# Patient Record
Sex: Female | Born: 1935 | Race: White | Hispanic: No | Smoking: Former smoker
Health system: Southern US, Community
[De-identification: ages and names within clinical notes are randomized; demographics above are authoritative.]

## PROBLEM LIST (undated history)

## (undated) DIAGNOSIS — K219 Gastro-esophageal reflux disease without esophagitis: Secondary | ICD-10-CM

## (undated) DIAGNOSIS — E079 Disorder of thyroid, unspecified: Secondary | ICD-10-CM

## (undated) DIAGNOSIS — R7302 Impaired glucose tolerance (oral): Secondary | ICD-10-CM

## (undated) DIAGNOSIS — N183 Chronic kidney disease, stage 3 unspecified: Secondary | ICD-10-CM

## (undated) DIAGNOSIS — I10 Essential (primary) hypertension: Secondary | ICD-10-CM

## (undated) DIAGNOSIS — I639 Cerebral infarction, unspecified: Secondary | ICD-10-CM

## (undated) DIAGNOSIS — N289 Disorder of kidney and ureter, unspecified: Secondary | ICD-10-CM

## (undated) DIAGNOSIS — E785 Hyperlipidemia, unspecified: Secondary | ICD-10-CM

## (undated) DIAGNOSIS — G4730 Sleep apnea, unspecified: Secondary | ICD-10-CM

## (undated) DIAGNOSIS — I4891 Unspecified atrial fibrillation: Secondary | ICD-10-CM

## (undated) DIAGNOSIS — J449 Chronic obstructive pulmonary disease, unspecified: Secondary | ICD-10-CM

## (undated) HISTORY — PX: BLADDER SURGERY: SHX569

## (undated) HISTORY — PX: CATARACT EXTRACTION: SUR2

## (undated) HISTORY — PX: ABDOMINAL HYSTERECTOMY: SHX81

## (undated) HISTORY — PX: LAMINECTOMY: SHX219

---

## 2005-06-21 ENCOUNTER — Ambulatory Visit: Payer: Self-pay | Admitting: Family Medicine

## 2005-07-01 ENCOUNTER — Ambulatory Visit: Payer: Self-pay | Admitting: Family Medicine

## 2006-01-10 ENCOUNTER — Ambulatory Visit: Payer: Self-pay | Admitting: Family Medicine

## 2007-01-11 ENCOUNTER — Ambulatory Visit: Payer: Self-pay | Admitting: Family Medicine

## 2007-12-11 ENCOUNTER — Ambulatory Visit: Payer: Self-pay | Admitting: Unknown Physician Specialty

## 2009-02-12 ENCOUNTER — Ambulatory Visit: Payer: Self-pay | Admitting: Family Medicine

## 2010-05-12 ENCOUNTER — Ambulatory Visit: Payer: Self-pay | Admitting: Specialist

## 2010-09-20 ENCOUNTER — Ambulatory Visit: Payer: Self-pay | Admitting: Family Medicine

## 2011-04-20 ENCOUNTER — Inpatient Hospital Stay: Payer: Self-pay | Admitting: Internal Medicine

## 2013-06-05 ENCOUNTER — Ambulatory Visit: Payer: Self-pay | Admitting: Ophthalmology

## 2017-05-18 ENCOUNTER — Emergency Department
Admission: EM | Admit: 2017-05-18 | Discharge: 2017-05-21 | Disposition: A | Discharge status: Home / Self Care | Payer: Medicare Other | Attending: Emergency Medicine | Admitting: Emergency Medicine

## 2017-05-18 ENCOUNTER — Emergency Department: Payer: Medicare Other

## 2017-05-18 DIAGNOSIS — R0902 Hypoxemia: Secondary | ICD-10-CM | POA: Insufficient documentation

## 2017-05-18 DIAGNOSIS — F0281 Dementia in other diseases classified elsewhere with behavioral disturbance: Secondary | ICD-10-CM | POA: Diagnosis not present

## 2017-05-18 DIAGNOSIS — Z7901 Long term (current) use of anticoagulants: Secondary | ICD-10-CM | POA: Diagnosis not present

## 2017-05-18 DIAGNOSIS — Z79899 Other long term (current) drug therapy: Secondary | ICD-10-CM | POA: Diagnosis not present

## 2017-05-18 DIAGNOSIS — G301 Alzheimer's disease with late onset: Secondary | ICD-10-CM | POA: Insufficient documentation

## 2017-05-18 DIAGNOSIS — R451 Restlessness and agitation: Secondary | ICD-10-CM | POA: Diagnosis not present

## 2017-05-18 DIAGNOSIS — I10 Essential (primary) hypertension: Secondary | ICD-10-CM | POA: Diagnosis not present

## 2017-05-18 DIAGNOSIS — F0390 Unspecified dementia without behavioral disturbance: Secondary | ICD-10-CM

## 2017-05-18 DIAGNOSIS — R4182 Altered mental status, unspecified: Secondary | ICD-10-CM | POA: Diagnosis present

## 2017-05-18 HISTORY — DX: Cerebral infarction, unspecified: I63.9

## 2017-05-18 HISTORY — DX: Chronic obstructive pulmonary disease, unspecified: J44.9

## 2017-05-18 HISTORY — DX: Disorder of kidney and ureter, unspecified: N28.9

## 2017-05-18 HISTORY — DX: Sleep apnea, unspecified: G47.30

## 2017-05-18 HISTORY — DX: Essential (primary) hypertension: I10

## 2017-05-18 HISTORY — DX: Gastro-esophageal reflux disease without esophagitis: K21.9

## 2017-05-18 HISTORY — DX: Chronic kidney disease, stage 3 (moderate): N18.3

## 2017-05-18 HISTORY — DX: Chronic kidney disease, stage 3 unspecified: N18.30

## 2017-05-18 HISTORY — DX: Hyperlipidemia, unspecified: E78.5

## 2017-05-18 HISTORY — DX: Impaired glucose tolerance (oral): R73.02

## 2017-05-18 HISTORY — DX: Disorder of thyroid, unspecified: E07.9

## 2017-05-18 HISTORY — DX: Unspecified atrial fibrillation: I48.91

## 2017-05-18 LAB — URINALYSIS, COMPLETE (UACMP) WITH MICROSCOPIC
BILIRUBIN URINE: NEGATIVE
Bacteria, UA: NONE SEEN
Glucose, UA: NEGATIVE mg/dL
Hgb urine dipstick: NEGATIVE
KETONES UR: 5 mg/dL — AB
Leukocytes, UA: NEGATIVE
Nitrite: NEGATIVE
PH: 5 (ref 5.0–8.0)
Protein, ur: >=300 mg/dL — AB
Specific Gravity, Urine: 1.024 (ref 1.005–1.030)

## 2017-05-18 LAB — COMPREHENSIVE METABOLIC PANEL
ALK PHOS: 78 U/L (ref 38–126)
ALT: 31 U/L (ref 14–54)
AST: 57 U/L — AB (ref 15–41)
Albumin: 3.9 g/dL (ref 3.5–5.0)
Anion gap: 12 (ref 5–15)
BUN: 29 mg/dL — AB (ref 6–20)
CHLORIDE: 104 mmol/L (ref 101–111)
CO2: 22 mmol/L (ref 22–32)
CREATININE: 1.43 mg/dL — AB (ref 0.44–1.00)
Calcium: 9.2 mg/dL (ref 8.9–10.3)
GFR calc Af Amer: 40 mL/min — ABNORMAL LOW (ref >60)
GFR, EST NON AFRICAN AMERICAN: 34 mL/min — AB (ref >60)
Glucose, Bld: 110 mg/dL — ABNORMAL HIGH (ref 65–99)
Potassium: 3.7 mmol/L (ref 3.5–5.1)
Sodium: 138 mmol/L (ref 135–145)
TOTAL PROTEIN: 6.4 g/dL — AB (ref 6.5–8.1)
Total Bilirubin: 2.1 mg/dL — ABNORMAL HIGH (ref 0.3–1.2)

## 2017-05-18 LAB — CBC WITH DIFFERENTIAL/PLATELET
BASOS ABS: 0 10*3/uL (ref 0–0.1)
Basophils Relative: 0 %
Eosinophils Absolute: 0 10*3/uL (ref 0–0.7)
Eosinophils Relative: 0 %
HEMATOCRIT: 45.9 % (ref 35.0–47.0)
HEMOGLOBIN: 15.1 g/dL (ref 12.0–16.0)
LYMPHS PCT: 5 %
Lymphs Abs: 0.3 10*3/uL — ABNORMAL LOW (ref 1.0–3.6)
MCH: 28.1 pg (ref 26.0–34.0)
MCHC: 32.9 g/dL (ref 32.0–36.0)
MCV: 85.4 fL (ref 80.0–100.0)
Monocytes Absolute: 0.5 10*3/uL (ref 0.2–0.9)
Monocytes Relative: 7 %
NEUTROS ABS: 6.7 10*3/uL — AB (ref 1.4–6.5)
Neutrophils Relative %: 88 %
Platelets: 162 10*3/uL (ref 150–440)
RBC: 5.38 MIL/uL — AB (ref 3.80–5.20)
RDW: 14.8 % — ABNORMAL HIGH (ref 11.5–14.5)
WBC: 7.6 10*3/uL (ref 3.6–11.0)

## 2017-05-18 LAB — ETHANOL: Alcohol, Ethyl (B): <5 mg/dL (ref <5)

## 2017-05-18 LAB — URINE DRUG SCREEN, QUALITATIVE (ARMC ONLY)
AMPHETAMINES, UR SCREEN: NOT DETECTED
BARBITURATES, UR SCREEN: NOT DETECTED
Benzodiazepine, Ur Scrn: NOT DETECTED
COCAINE METABOLITE, UR ~~LOC~~: NOT DETECTED
Cannabinoid 50 Ng, Ur ~~LOC~~: NOT DETECTED
MDMA (ECSTASY) UR SCREEN: NOT DETECTED
METHADONE SCREEN, URINE: NOT DETECTED
OPIATE, UR SCREEN: NOT DETECTED
Phencyclidine (PCP) Ur S: NOT DETECTED
Tricyclic, Ur Screen: NOT DETECTED

## 2017-05-18 LAB — PROTIME-INR
INR: 3.42
Prothrombin Time: 35.3 seconds — ABNORMAL HIGH (ref 11.4–15.2)

## 2017-05-18 LAB — TROPONIN I
TROPONIN I: 0.04 ng/mL — AB (ref <0.03)
Troponin I: 0.03 ng/mL (ref <0.03)

## 2017-05-18 LAB — ACETAMINOPHEN LEVEL: Acetaminophen (Tylenol), Serum: <10 ug/mL — ABNORMAL LOW (ref 10–30)

## 2017-05-18 LAB — LIPASE, BLOOD: LIPASE: 18 U/L (ref 11–51)

## 2017-05-18 LAB — TSH: TSH: 3.281 u[IU]/mL (ref 0.350–4.500)

## 2017-05-18 LAB — SALICYLATE LEVEL: Salicylate Lvl: <7 mg/dL (ref 2.8–30.0)

## 2017-05-18 MED ORDER — LORAZEPAM 2 MG/ML IJ SOLN
INTRAMUSCULAR | Status: AC
  Administered 2017-05-18: 1 mg
  Filled 2017-05-18: qty 1

## 2017-05-18 MED ORDER — HALOPERIDOL LACTATE 5 MG/ML IJ SOLN
INTRAMUSCULAR | Status: AC
  Administered 2017-05-18: 5 mg
  Filled 2017-05-18: qty 1

## 2017-05-18 NOTE — ED Notes (Addendum)
Pt on 4L nasal canula at this time , 93%

## 2017-05-18 NOTE — ED Notes (Signed)
Date and time results received: 05/18/17 1221 (use smartphrase ".now" to insert current time)  Test: troponin Critical Value: 0.03   Name of Provider Notified: schaevitz

## 2017-05-18 NOTE — ED Notes (Signed)
Family at bedside asking for update, this RN made MD aware at this time

## 2017-05-18 NOTE — ED Notes (Signed)
PT IVC PENDING PSYCH CONSULT. 

## 2017-05-18 NOTE — ED Notes (Signed)
Date and time results received: 05/18/17 1400 (use smartphrase ".now" to insert current time)  Test: troponin  Critical Value: 0.04  Name of Provider Notified: schaevitz  Orders Received? Or Actions Taken?: Orders Received - See Orders for details and Actions Taken: none at this time

## 2017-05-18 NOTE — Consult Note (Signed)
[facility]   Reason for Consult:  Consult for 81 year old woman brought in by family under petition because of dementia with behavior problems Referring Physician:  Quentin Cornwall Patient Identification: Catherine Harris MRN:  [id number] Principal Diagnosis: Dementia Diagnosis:   Patient Active Problem List   Diagnosis Date Noted  . Dementia [F03.90] 05/18/2017    Total Time spent with patient: 1 hour  Subjective:   Catherine Harris is a 81 y.o. female patient admitted with "I'm fine, I just want to go home".  HPI:  Patient interviewed chart reviewed. 81 year old woman brought in under IVC. Patient was allegedly sitting on her porch with her underwear on tearing her own mail up into little pieces. This is just one example of several odd and irresponsible behaviors that her stepdaughter is reporting. Also reporting that the patient has been not eating well not sleeping well probably not taking care of herself and multiple times has been found not using her oxygen correctly. It sounds like the family has been increasingly concerned about her dementia. They have taken her to see a neurologist on several occasions. No insight about this. She denies having any problem with memory or function. Denies any misuse of her oxygen. She says her mood feels fine. She does admit that she doesn't sleep very well and that her appetite is down. She says she thinks she is doing fine with her medicine.  Social history: Lives by herself. Closest family member from what I can tell seems to be her stepdaughter. Sounds like family checks up on her reasonably regularly.  Medical history: Patient has a history of strokes. She is on chronic anticoagulation and I'm not completely clear what that's about. She is also supposed to be using home oxygen.  Substance abuse history: Patient denies any alcohol or drug abuse current or past. Collateral history from the family is that decades ago she had a  drinking problem but stopped probably at least 30 years ago    Past Psychiatric History: No known past psychiatric hospitalization. I confronted the patient about the allegation that she had mentioned [facility]. She completely denied having ever been to that hospital or any other psych hospital. Denied any history of diagnosis with mental health problems denied any suicidal or homicidal behavior. Had no awareness about her memory problems  Risk to Self:   Risk to Others:   Prior Inpatient Therapy:   Prior Outpatient Therapy:    Past Medical History: No past medical history on file. No past surgical history on file. Family History: No family history on file. Family Psychiatric  History: She does not know of any Social History:  History  Alcohol use Not on file     History  Drug use: Unknown    Social History   Social History  . Marital status: Widowed    Spouse name: N/A  . Number of children: N/A  . Years of education: N/A   Social History Main Topics  . Smoking status: Not on file  . Smokeless tobacco: Not on file  . Alcohol use Not on file  . Drug use: Unknown  . Sexual activity: Not on file   Other Topics Concern  . Not on file   Social History Narrative  . No narrative on file   Additional Social History:    Allergies:  Allergies not on file  Labs:  Results for orders placed or performed during the hospital encounter of 05/18/17 (from the past 48 hour(s))  CBC  with Differential     Status: Abnormal   Collection Time: 05/18/17 11:17 AM  Result Value Ref Range   WBC 7.6 3.6 - 11.0 K/uL   RBC 5.38 (H) 3.80 - 5.20 MIL/uL   Hemoglobin 15.1 12.0 - 16.0 g/dL   HCT 45.9 35.0 - 47.0 %   MCV 85.4 80.0 - 100.0 fL   MCH 28.1 26.0 - 34.0 pg   MCHC 32.9 32.0 - 36.0 g/dL   RDW 14.8 (H) 11.5 - 14.5 %   Platelets 162 150 - 440 K/uL   Neutrophils Relative % 88 %   Neutro Abs 6.7 (H) 1.4 - 6.5 K/uL   Lymphocytes Relative 5 %   Lymphs Abs 0.3 (L) 1.0 - 3.6 K/uL    Monocytes Relative 7 %   Monocytes Absolute 0.5 0.2 - 0.9 K/uL   Eosinophils Relative 0 %   Eosinophils Absolute 0.0 0 - 0.7 K/uL   Basophils Relative 0 %   Basophils Absolute 0.0 0 - 0.1 K/uL  Comprehensive metabolic panel     Status: Abnormal   Collection Time: 05/18/17 11:17 AM  Result Value Ref Range   Sodium 138 135 - 145 mmol/L   Potassium 3.7 3.5 - 5.1 mmol/L   Chloride 104 101 - 111 mmol/L   CO2 22 22 - 32 mmol/L   Glucose, Bld 110 (H) 65 - 99 mg/dL   BUN 29 (H) 6 - 20 mg/dL   Creatinine, Ser 1.43 (H) 0.44 - 1.00 mg/dL   Calcium 9.2 8.9 - 10.3 mg/dL   Total Protein 6.4 (L) 6.5 - 8.1 g/dL   Albumin 3.9 3.5 - 5.0 g/dL   AST 57 (H) 15 - 41 U/L   ALT 31 14 - 54 U/L   Alkaline Phosphatase 78 38 - 126 U/L   Total Bilirubin 2.1 (H) 0.3 - 1.2 mg/dL   GFR calc non Af Amer 34 (L) >60 mL/min   GFR calc Af Amer 40 (L) >60 mL/min    Comment: (NOTE) The eGFR has been calculated using the CKD EPI equation. This calculation has not been validated in all clinical situations. eGFR's persistently <60 mL/min signify possible Chronic Kidney Disease.    Anion gap 12 5 - 15  Lipase, blood     Status: None   Collection Time: 05/18/17 11:17 AM  Result Value Ref Range   Lipase 18 11 - 51 U/L  Troponin I     Status: Abnormal   Collection Time: 05/18/17 11:17 AM  Result Value Ref Range   Troponin I 0.03 (HH) <0.03 ng/mL    Comment: CRITICAL RESULT CALLED TO, READ BACK BY AND VERIFIED WITH [place] LOYE 05/18/17 1221 KLW   Protime-INR     Status: Abnormal   Collection Time: 05/18/17 11:17 AM  Result Value Ref Range   Prothrombin Time 35.3 (H) 11.4 - 15.2 seconds   INR 3.42   Urinalysis, Complete w Microscopic     Status: Abnormal   Collection Time: 05/18/17 11:17 AM  Result Value Ref Range   Color, Urine AMBER (A) YELLOW    Comment: BIOCHEMICALS MAY BE AFFECTED BY COLOR   APPearance HAZY (A) CLEAR   Specific Gravity, Urine 1.024 1.005 - 1.030   pH 5.0 5.0 - 8.0   Glucose, UA NEGATIVE  NEGATIVE mg/dL   Hgb urine dipstick NEGATIVE NEGATIVE   Bilirubin Urine NEGATIVE NEGATIVE   Ketones, ur 5 (A) NEGATIVE mg/dL   Protein, ur >=300 (A) NEGATIVE mg/dL   Nitrite NEGATIVE NEGATIVE  Leukocytes, UA NEGATIVE NEGATIVE   RBC / HPF 0-5 0 - 5 RBC/hpf   WBC, UA 0-5 0 - 5 WBC/hpf   Bacteria, UA NONE SEEN NONE SEEN   Squamous Epithelial / LPF 0-5 (A) NONE SEEN   Mucous PRESENT    Hyaline Casts, UA PRESENT   TSH     Status: None   Collection Time: 05/18/17 11:17 AM  Result Value Ref Range   TSH 3.281 0.350 - 4.500 uIU/mL    Comment: Performed by a 3rd Generation assay with a functional sensitivity of <=0.01 uIU/mL.  Acetaminophen level     Status: Abnormal   Collection Time: 05/18/17 11:17 AM  Result Value Ref Range   Acetaminophen (Tylenol), Serum <10 (L) 10 - 30 ug/mL    Comment:        THERAPEUTIC CONCENTRATIONS VARY SIGNIFICANTLY. A RANGE OF 10-30 ug/mL MAY BE AN EFFECTIVE CONCENTRATION FOR MANY PATIENTS. HOWEVER, SOME ARE BEST TREATED AT CONCENTRATIONS OUTSIDE THIS RANGE. ACETAMINOPHEN CONCENTRATIONS >150 ug/mL AT 4 HOURS AFTER INGESTION AND >50 ug/mL AT 12 HOURS AFTER INGESTION ARE OFTEN ASSOCIATED WITH TOXIC REACTIONS.   Salicylate level     Status: None   Collection Time: 05/18/17 11:17 AM  Result Value Ref Range   Salicylate Lvl <7.0 2.8 - 30.0 mg/dL  Urine Drug Screen, Qualitative ([facility] only)     Status: None   Collection Time: 05/18/17 11:17 AM  Result Value Ref Range   Tricyclic, Ur Screen NONE DETECTED NONE DETECTED   Amphetamines, Ur Screen NONE DETECTED NONE DETECTED   MDMA (Ecstasy)Ur Screen NONE DETECTED NONE DETECTED   Cocaine Metabolite,Ur [place] NONE DETECTED NONE DETECTED   Opiate, Ur Screen NONE DETECTED NONE DETECTED   Phencyclidine (PCP) Ur S NONE DETECTED NONE DETECTED   Cannabinoid 50 Ng, Ur [place] NONE DETECTED NONE DETECTED   Barbiturates, Ur Screen NONE DETECTED NONE DETECTED   Benzodiazepine, Ur Scrn NONE DETECTED NONE DETECTED   Methadone  Scn, Ur NONE DETECTED NONE DETECTED    Comment: (NOTE) 100  Tricyclics, urine               Cutoff 1000 ng/mL 200  Amphetamines, urine             Cutoff 1000 ng/mL 300  MDMA (Ecstasy), urine           Cutoff 500 ng/mL 400  Cocaine Metabolite, urine       Cutoff 300 ng/mL 500  Opiate, urine                   Cutoff 300 ng/mL 600  Phencyclidine (PCP), urine      Cutoff 25 ng/mL 700  Cannabinoid, urine              Cutoff 50 ng/mL 800  Barbiturates, urine             Cutoff 200 ng/mL 900  Benzodiazepine, urine           Cutoff 200 ng/mL 1000 Methadone, urine                Cutoff 300 ng/mL 1100 1200 The urine drug screen provides only a preliminary, unconfirmed 1300 analytical test result and should not be used for non-medical 1400 purposes. Clinical consideration and professional judgment should 1500 be applied to any positive drug screen result due to possible 1600 interfering substances. A more specific alternate chemical method 1700 must be used in order to obtain a confirmed analytical result.  1800 Gas chromato  graphy / mass spectrometry (GC/MS) is the preferred 1900 confirmatory method.   Ethanol     Status: None   Collection Time: 05/18/17 11:17 AM  Result Value Ref Range   Alcohol, Ethyl (B) <5 <5 mg/dL    Comment:        LOWEST DETECTABLE LIMIT FOR SERUM ALCOHOL IS 5 mg/dL FOR MEDICAL PURPOSES ONLY   Troponin I     Status: Abnormal   Collection Time: 05/18/17  1:16 PM  Result Value Ref Range   Troponin I 0.04 (HH) <0.03 ng/mL    Comment: CRITICAL RESULT CALLED TO, READ BACK BY AND VERIFIED WITH [place] LOYE 05/18/17 1403 ALV     No current facility-administered medications for this encounter.    Current Outpatient Prescriptions  Medication Sig Dispense Refill  . furosemide (LASIX) 20 MG tablet Take 20 mg by mouth daily.    Marland Kitchen lisinopril (PRINIVIL,ZESTRIL) 5 MG tablet Take 5 mg by mouth daily.    . metoprolol tartrate (LOPRESSOR) 50 MG tablet Take 50 mg by mouth 2  (two) times daily.    Marland Kitchen omeprazole (PRILOSEC) 20 MG capsule Take 20 mg by mouth daily.    . simvastatin (ZOCOR) 40 MG tablet Take 40 mg by mouth every evening.    . tolterodine (DETROL LA) 2 MG 24 hr capsule Take 1 capsule by mouth daily.    Marland Kitchen warfarin (COUMADIN) 2 MG tablet Take 3 mg by mouth daily.    Marland Kitchen donepezil (ARICEPT) 5 MG tablet Take 5 mg by mouth at bedtime.    . Multiple Vitamin (MULTI-VITAMINS) TABS Take 1 tablet by mouth daily.      Musculoskeletal: Strength & Muscle Tone: decreased Gait & Station: unsteady Patient leans: N/A  Psychiatric Specialty Exam: Physical Exam  Nursing note and vitals reviewed. Constitutional: She appears well-developed and well-nourished.  HENT:  Head: Normocephalic and atraumatic.  Eyes: Conjunctivae are normal. Pupils are equal, round, and reactive to light.  Neck: Normal range of motion.  Cardiovascular: Regular rhythm and normal heart sounds.   Respiratory: She is in respiratory distress.  GI: Soft.  Musculoskeletal: Normal range of motion.  Neurological: She is alert.  Skin: Skin is warm and dry.  Psychiatric: Her affect is blunt. Her speech is delayed. She is slowed. Thought content is not paranoid. Cognition and memory are impaired. She expresses impulsivity. She expresses no homicidal and no suicidal ideation. She exhibits abnormal recent memory.    Review of Systems  Constitutional: Positive for weight loss.  HENT: Negative.   Eyes: Negative.   Respiratory: Negative.   Cardiovascular: Negative.   Gastrointestinal: Negative.   Musculoskeletal: Negative.   Skin: Negative.   Neurological: Negative.   Psychiatric/Behavioral: Negative for depression, hallucinations, memory loss, substance abuse and suicidal ideas. The patient has insomnia. The patient is not nervous/anxious.     Blood pressure (!) 143/88, pulse 87, temperature 98.1 F (36.7 C), temperature source Oral, resp. rate (!) 21, SpO2 100 %.There is no height or weight on  file to calculate BMI.  General Appearance: Casual  Eye Contact:  Fair  Speech:  Slow  Volume:  Decreased  Mood:  Euthymic  Affect:  Constricted  Thought Process:  Coherent  Orientation:  Other:  She is oriented to being in the hospital and what town this is. She knows the season but does not know the correct year.  Thought Content:  Rumination  Suicidal Thoughts:  No  Homicidal Thoughts:  No  Memory:  Immediate;   Fair Recent;  Poor Remote;   Fair  Judgement:  Impaired  Insight:  Shallow  Psychomotor Activity:  Decreased  Concentration:  Concentration: Fair  Recall:  [employer] of Knowledge:  Fair  Language:  Fair  Akathisia:  No  Handed:  Right  AIMS (if indicated):     Assets:  Housing Social Support  ADL's:  Impaired  Cognition:  Impaired,  Mild  Sleep:        Treatment Plan Summary: Medication management and Plan 81 year old woman who appears to have at least a mild degree of dementia probably related to strokes and vascular changes as well as Alzheimer's disease. Patient is not suicidal or homicidal. No evidence of major injury to herself. No clear evidence of acute dangerousness. Her insight is poor and it will be hard to convince her to move into assisted living. Patient however does not meet commitment criteria and does not require inpatient psychiatric level treatment. No evidence of psychosis or aggression related to dementia. Supportive counseling completed with the patient. Case reviewed with emergency room physician and TTS. Discontinue involuntary commitment paperwork.  Disposition: No evidence of imminent risk to self or others at present.   Patient does not meet criteria for psychiatric inpatient admission. Supportive therapy provided about ongoing stressors.  Alethia Berthold, MD 05/18/2017 5:50 PM

## 2017-05-18 NOTE — ED Provider Notes (Signed)
[facility] Emergency Department Provider Note  ____________________________________________   First MD Initiated Contact with Patient 05/18/17 1102     (approximate)  I have reviewed the triage vital signs and the nursing notes.   HISTORY  Chief Complaint Altered Mental Status   HPI Catherine Harris is a 81 y.o. female with a history of hypertension as well as dementia andatrial fibrillation on Coumadin who is presenting to the emergency department today with altered mental status. Per the notes on the record the patient was found sitting on her front steps in her underwear. She was also very confused and disoriented. Per her recent note from her primary care doctor on June 15 patient has had increasingly erratic/confrontational attitudes towards her care. Patient had been refusing to have vitals taken on June 11 by a nurse. Patient is still currently living by herself in her home. Per the patient at this time, she says that she feels "fine." Prior to my evaluating her she received 5 of Haldol as well as 1 of Ativan IM for restlessness and agitation here in the emergency department.  She denies any pain. Denies any shortness of breath.   No past medical history on file.  Patient Active Problem List   Diagnosis Date Noted  . Dementia 05/18/2017    No past surgical history on file.  Prior to Admission medications   Medication Sig Start Date End Date Taking? Authorizing Provider  furosemide (LASIX) 20 MG tablet Take 20 mg by mouth daily. 05/08/17  Yes [provider]  lisinopril (PRINIVIL,ZESTRIL) 5 MG tablet Take 5 mg by mouth daily. 03/17/17  Yes [provider]  metoprolol tartrate (LOPRESSOR) 50 MG tablet Take 50 mg by mouth 2 (two) times daily. 02/23/17  Yes [provider]  omeprazole (PRILOSEC) 20 MG capsule Take 20 mg by mouth daily. 03/17/17  Yes [provider]  simvastatin (ZOCOR) 40 MG tablet Take 40 mg by  mouth every evening. 03/10/17  Yes [provider]  tolterodine (DETROL LA) 2 MG 24 hr capsule Take 1 capsule by mouth daily. 11/22/16  Yes [provider]  warfarin (COUMADIN) 2 MG tablet Take 3 mg by mouth daily. 03/17/17  Yes [provider]  donepezil (ARICEPT) 5 MG tablet Take 5 mg by mouth at bedtime.    [provider]  Multiple Vitamin (MULTI-VITAMINS) TABS Take 1 tablet by mouth daily.    [provider]    Allergies Patient has no allergy information on record.  No family history on file.  Social History Social History  Substance Use Topics  . Smoking status: Not on file  . Smokeless tobacco: Not on file  . Alcohol use Not on file    Review of Systems  Constitutional: No fever/chills Eyes: No visual changes. ENT: No sore throat. Cardiovascular: Denies chest pain. Respiratory: Denies shortness of breath. Gastrointestinal: No abdominal pain.  No nausea, no vomiting.  No diarrhea.  No constipation. Genitourinary: Negative for dysuria. Musculoskeletal: Negative for back pain. Skin: Negative for rash. Neurological: Negative for headaches, focal weakness or numbness.   ____________________________________________   PHYSICAL EXAM:  VITAL SIGNS: ED Triage Vitals  Enc Vitals Group     BP 05/18/17 1035 (!) 192/116     Pulse Rate 05/18/17 1035 60     Resp 05/18/17 1035 16     Temp 05/18/17 1107 98.1 F (36.7 C)     Temp Source 05/18/17 1107 Oral     SpO2 05/18/17 1035 99 %  Weight --      Height --      Head Circumference --      Peak Flow --      Pain Score --      Pain Loc --      Pain Edu? --      Excl. in GC? --     Constitutional: Alert but disoriented. Well appearing and in no acute distress. Eyes: Conjunctivae are normal.  Head: Atraumatic. Nose: No congestion/rhinnorhea. Mouth/Throat: Mucous membranes are moist.  Neck: No stridor.   Cardiovascular:  Irregularly irregular.  Grossly normal heart  sounds.  Respiratory: Normal respiratory effort but with tachypnea and in her mid and cough. Lungs are they're to auscultation throughout.  Patient hypoxic to 80-84% on room air. Refusing nasal cannula oxygen. With blow-by oxygen patient's oxygen saturation is resolving.  Gastrointestinal: Soft and nontender. No distention. No CVA tenderness. Musculoskeletal: No lower extremity tenderness nor edema.  No joint effusions. Neurologic:  Normal speech and language. No gross focal neurologic deficits are appreciated. Skin:  Skin is warm, dry and intact. No rash noted. Psychiatric: Mood and affect are normal. Speech and behavior are normal.  ____________________________________________   LABS (all labs ordered are listed, but only abnormal results are displayed)  Labs Reviewed  CBC WITH DIFFERENTIAL/PLATELET - Abnormal; Notable for the following:       Result Value   RBC 5.38 (*)    RDW 14.8 (*)    Neutro Abs 6.7 (*)    Lymphs Abs 0.3 (*)    All other components within normal limits  COMPREHENSIVE METABOLIC PANEL - Abnormal; Notable for the following:    Glucose, Bld 110 (*)    BUN 29 (*)    Creatinine, Ser 1.43 (*)    Total Protein 6.4 (*)    AST 57 (*)    Total Bilirubin 2.1 (*)    GFR calc non Af Amer 34 (*)    GFR calc Af Amer 40 (*)    All other components within normal limits  TROPONIN I - Abnormal; Notable for the following:    Troponin I 0.03 (*)    All other components within normal limits  PROTIME-INR - Abnormal; Notable for the following:    Prothrombin Time 35.3 (*)    All other components within normal limits  URINALYSIS, COMPLETE (UACMP) WITH MICROSCOPIC - Abnormal; Notable for the following:    Color, Urine AMBER (*)    APPearance HAZY (*)    Ketones, ur 5 (*)    Protein, ur >=300 (*)    Squamous Epithelial / LPF 0-5 (*)    All other components within normal limits  TROPONIN I - Abnormal; Notable for the following:    Troponin I 0.04 (*)    All other  components within normal limits  ACETAMINOPHEN LEVEL - Abnormal; Notable for the following:    Acetaminophen (Tylenol), Serum <10 (*)    All other components within normal limits  URINE CULTURE  LIPASE, BLOOD  TSH  SALICYLATE LEVEL  URINE DRUG SCREEN, QUALITATIVE ([facility] ONLY)  ETHANOL   ____________________________________________  EKG  ED ECG REPORT I, Arelia Longest, the attending physician, personally viewed and interpreted this ECG.   Date: 05/18/2017  EKG Time: 1131  Rate: 91  Rhythm: atrial fibrillation, rate 91   Axis: normal  Intervals:none  ST&T Change: No ST segment elevation or depression. No abnormal T-wave inversion.  ____________________________________________  RADIOLOGY  No acute finding on the patient's imaging. ____________________________________________  PROCEDURES  Procedure(s) performed:   Procedures  Critical Care performed:   ____________________________________________   INITIAL IMPRESSION / ASSESSMENT AND PLAN / ED COURSE  Pertinent labs & imaging results that were available during my care of the patient were reviewed by me and considered in my medical decision making (see chart for details).    Clinical Course as of May 18 2099  Thu May 18, 2017  1204 CT Head Wo Contrast [DS]  1344 Patient with reassuring lab work except for a very mildly elevated troponin at 0.03. Patient's family is now the bedside and is saying that the patient has had decreasing cognitive abilities over the past year and then over the past 2 weeks things have acutely worsened. Suspecting psychiatric cause of the patient's presentation today. As long as the second troponin is stable then she will be transferred to the quad for further psychiatric evaluation. I discussed this plan with the family and they're understanding of this and willing to comply. Also with resolution of the hypoxia with nasal cannula oxygen.  [DS]    Clinical Course User Index [DS]  Myrna BlazerSchaevitz, David Matthew, MD   ----------------------------------------- 9:00 PM on 05/18/2017 -----------------------------------------  Involuntary treatment was completed for this patient. Transferred to the a side of the major section of the department for further evaluation by psychiatry.  ____________________________________________   FINAL CLINICAL IMPRESSION(S) / ED DIAGNOSES  Dementia. Agitation. Hypoxia.    NEW MEDICATIONS STARTED DURING THIS VISIT:  New Prescriptions   No medications on file     Note:  This document was prepared using Dragon voice recognition software and may include unintentional dictation errors.     Myrna BlazerSchaevitz, David Matthew, MD 05/18/17 2101

## 2017-05-18 NOTE — ED Notes (Signed)
Crystal, NT at bedside

## 2017-05-18 NOTE — BH Assessment (Signed)
Spoke with the patient's step daughter, Catherine Harris, who states patient has become aggressive over the last few weeks, as well as, having an episode where she was found on the floor and unable to get up on her own.   Since that time the patient has a home healthcare worker: a Engineer, civil (consulting)nurse and occupational therapist coming x2 weekly. NP, Catherine Harris noted symptoms of dementia and referred to a neurologist. Neurologist, Catherine Harris started the patient on medication for dementia.   Patient lives alone, has decreased appetite, refusing meals on wheels. Possibly not sleeping well. Called Ms.  in the middle of the night on Saturday. The patient is often unable to remember how to call others on her phone or change the channel on her TV.  The patient has been agitated "with everyone", her PA, home health workers, etc.   Ms. Catherine Harris doesn't know anything about the patient's mental health prior to the late 80's when she married her father. States she did make a statement the other week about feeling as if she was dying and then related it to "all those years at [employer]."  According to Ms. Draughn the patient was a heavy drinker, loud and aggressive before marrying her father. After about 5 yrs of marriage they both stopped drinking and both quit smoking cigarettes, in 2001.   Ms. Catherine Harris states the patients physicians are concerned about cardiac issues. She was to have an echocardiogram tomorrow.

## 2017-05-18 NOTE — ED Notes (Signed)
Per lab pt does not have enough urine to run UDS, MD made aware

## 2017-05-18 NOTE — ED Notes (Addendum)
Pt given blow by o2 at this time, pt refuses to wear [place]. Pt sats up to 91%

## 2017-05-18 NOTE — ED Notes (Signed)
Lab called for add on blood work at this time

## 2017-05-18 NOTE — ED Notes (Signed)
ED Provider at bedside. 

## 2017-05-18 NOTE — ED Notes (Addendum)
Sitter at bedside with pt.  Pt moved to hospital bed, multiple bruises noted all over pt upper ext.  pt with small skin tears to bilat wrists.  Pt drowsy, easily arousable but then falls back to sleep.

## 2017-05-18 NOTE — ED Triage Notes (Signed)
Pt reports to the ED for eval of altered mental status. She has hx of dementia her step-daughter found her sitting on her stoop shredding her mail stating that it is not hers. She has been acutely decompensating over the past 2 weeks. She is supposed to be on O2 and she did not have her O2 connected to the tank. She lives by herself at home. She is confused to place, time, and situation. She is oriented to person. EMS also reports a strong odor to her urine. Pt combative and keeps repeating she wants to go home.

## 2017-05-18 NOTE — ED Notes (Signed)
Patient transported to CT with NT

## 2017-05-18 NOTE — ED Notes (Signed)
Pt sleeping but will arouse and answer questions appropriately. Pt 93% on 3L  [place]. NT at bedside for safety sitter

## 2017-05-19 ENCOUNTER — Encounter: Payer: Self-pay | Admitting: Emergency Medicine

## 2017-05-19 LAB — URINE CULTURE: CULTURE: NO GROWTH

## 2017-05-19 LAB — BASIC METABOLIC PANEL
Anion gap: 7 (ref 5–15)
BUN: 23 mg/dL — AB (ref 6–20)
CALCIUM: 8.6 mg/dL — AB (ref 8.9–10.3)
CHLORIDE: 106 mmol/L (ref 101–111)
CO2: 26 mmol/L (ref 22–32)
CREATININE: 1.02 mg/dL — AB (ref 0.44–1.00)
GFR calc Af Amer: 59 mL/min — ABNORMAL LOW (ref >60)
GFR calc non Af Amer: 51 mL/min — ABNORMAL LOW (ref >60)
Glucose, Bld: 101 mg/dL — ABNORMAL HIGH (ref 65–99)
Potassium: 4.1 mmol/L (ref 3.5–5.1)
SODIUM: 139 mmol/L (ref 135–145)

## 2017-05-19 LAB — CK: CK TOTAL: 237 U/L — AB (ref 38–234)

## 2017-05-19 LAB — PROTIME-INR
INR: 3.64
Prothrombin Time: 37.1 seconds — ABNORMAL HIGH (ref 11.4–15.2)

## 2017-05-19 MED ORDER — PANTOPRAZOLE SODIUM 40 MG PO TBEC
40.0000 mg | DELAYED_RELEASE_TABLET | Freq: Every day | ORAL | Status: DC
  Administered 2017-05-19 – 2017-05-21 (×3): 40 mg via ORAL
  Filled 2017-05-19 (×3): qty 1

## 2017-05-19 MED ORDER — DONEPEZIL HCL 5 MG PO TABS
5.0000 mg | ORAL_TABLET | Freq: Every day | ORAL | Status: DC
  Administered 2017-05-20: 5 mg via ORAL
  Filled 2017-05-19 (×3): qty 1

## 2017-05-19 MED ORDER — WARFARIN - PHARMACIST DOSING INPATIENT
Freq: Every day | Status: DC
  Filled 2017-05-19 (×6): qty 1

## 2017-05-19 MED ORDER — METOPROLOL TARTRATE 50 MG PO TABS
50.0000 mg | ORAL_TABLET | Freq: Two times a day (BID) | ORAL | Status: DC
  Administered 2017-05-20 – 2017-05-21 (×3): 50 mg via ORAL
  Filled 2017-05-19 (×5): qty 1

## 2017-05-19 MED ORDER — LISINOPRIL 5 MG PO TABS
5.0000 mg | ORAL_TABLET | Freq: Every day | ORAL | Status: DC
  Administered 2017-05-19 – 2017-05-21 (×3): 5 mg via ORAL
  Filled 2017-05-19 (×3): qty 1

## 2017-05-19 MED ORDER — METOPROLOL TARTRATE 50 MG PO TABS
50.0000 mg | ORAL_TABLET | Freq: Once | ORAL | Status: AC
  Administered 2017-05-19: 50 mg via ORAL
  Filled 2017-05-19: qty 1

## 2017-05-19 NOTE — Clinical Social Work Note (Signed)
CSW left a message with pt's step-daughter regarding bed offers. [facility]. Auth is pending, however case manager was aware that pt will be ready for discharge tomorrow. CSW will continue to follow.   Dede QuerySarah Mercer Stallworth, MSW, LCSW  Clinical Social Worker  [phone]

## 2017-05-19 NOTE — ED Notes (Signed)
Pt resting quietly in bed, no needs at this time. NO longer needs a Recruitment consultantsafety sitter as patient is calm and cooperative. Dicussed plan of PT and pt agreeable.

## 2017-05-19 NOTE — NC FL2 (Signed)
[place] MEDICAID FL2 LEVEL OF CARE SCREENING TOOL     IDENTIFICATION  Patient Name: Catherine Harris Birthdate: Mar 13, 1936 Sex: female Admission Date (Current Location): 05/18/2017  [place]   Facility and Address:  [facility], [address], [place], [place] [address]      Provider Number: [id number]  Attending Physician Name and Address:  No att. providers found  Relative Name and Phone Number:       Current Level of Care: Hospital Recommended Level of Care: Skilled Nursing Facility Prior Approval Number:    Date Approved/Denied:   PASRR Number: [id number]  Discharge Plan: SNF    Current Diagnoses: Patient Active Problem List   Diagnosis Date Noted  . Dementia 05/18/2017    Orientation RESPIRATION BLADDER Height & Weight     Self, Place  O2 (4L) Continent Weight:   Height:     BEHAVIORAL SYMPTOMS/MOOD NEUROLOGICAL BOWEL NUTRITION STATUS      Continent Diet (Regular Diet)  AMBULATORY STATUS COMMUNICATION OF NEEDS Skin   Limited Assist Verbally Normal                       Personal Care Assistance Level of Assistance  Bathing, Feeding, Dressing Bathing Assistance: Limited assistance Feeding assistance: Independent Dressing Assistance: Limited assistance     Functional Limitations Info  Sight, Hearing, Speech Sight Info: Adequate Hearing Info: Adequate Speech Info: Adequate    SPECIAL CARE FACTORS FREQUENCY  PT (By licensed PT)     PT Frequency: 5              Contractures Contractures Info: Not present    Additional Factors Info  Code Status, Allergies Code Status Info: Full Code Allergies Info: No known allergies           Current Medications (05/19/2017):  This is the current hospital active medication list Current Facility-Administered Medications  Medication Dose Route Frequency Provider Last Rate Last Dose  . donepezil (ARICEPT) tablet 5 mg  5 mg Oral QHS Sharyn CreamerQuale,  Mark, MD      . lisinopril (PRINIVIL,ZESTRIL) tablet 5 mg  5 mg Oral Daily Sharyn CreamerQuale, Mark, MD   5 mg at 05/19/17 1016  . metoprolol tartrate (LOPRESSOR) tablet 50 mg  50 mg Oral BID Sharyn CreamerQuale, Mark, MD      . pantoprazole (PROTONIX) EC tablet 40 mg  40 mg Oral Daily Sharyn CreamerQuale, Mark, MD   40 mg at 05/19/17 1016  . Warfarin - Pharmacist Dosing Inpatient   Does not apply [id number] Sharyn CreamerQuale, Mark, MD       Current Outpatient Prescriptions  Medication Sig Dispense Refill  . furosemide (LASIX) 20 MG tablet Take 20 mg by mouth daily.    Marland Kitchen. lisinopril (PRINIVIL,ZESTRIL) 5 MG tablet Take 5 mg by mouth daily.    . metoprolol tartrate (LOPRESSOR) 50 MG tablet Take 50 mg by mouth 2 (two) times daily.    Marland Kitchen. omeprazole (PRILOSEC) 20 MG capsule Take 20 mg by mouth daily.    . simvastatin (ZOCOR) 40 MG tablet Take 40 mg by mouth every evening.    . tolterodine (DETROL LA) 2 MG 24 hr capsule Take 1 capsule by mouth daily.    Marland Kitchen. warfarin (COUMADIN) 2 MG tablet Take 3 mg by mouth daily.    Marland Kitchen. donepezil (ARICEPT) 5 MG tablet Take 5 mg by mouth at bedtime.    . Multiple Vitamin (MULTI-VITAMINS) TABS Take 1 tablet by mouth daily.  Discharge Medications: Please see discharge summary for a list of discharge medications.  Relevant Imaging Results:  Relevant Lab Results:   Additional Information SSN:  [id number]  Dede Query, LCSW

## 2017-05-19 NOTE — Progress Notes (Signed)
ANTICOAGULATION CONSULT NOTE - Initial Consult  Pharmacy Consult for Warfarin Indication: atrial fibrillation  No Known Allergies  Patient Measurements:   Heparin Dosing Weight:   Vital Signs: Temp: 98 F (36.7 C) (06/21 2158) Temp Source: Oral (06/21 2158) BP: 154/90 (06/22 0800) Pulse Rate: 70 (06/22 0800)  Labs:  Recent Labs  05/18/17 1117 05/18/17 1316 05/19/17 0708 05/19/17 0812  HGB 15.1  --   --   --   HCT 45.9  --   --   --   PLT 162  --   --   --   LABPROT 35.3*  --   --  37.1*  INR 3.42  --   --  3.64  CREATININE 1.43*  --  1.02*  --   CKTOTAL  --   --  237*  --   TROPONINI 0.03* 0.04*  --   --     CrCl cannot be calculated (Unknown ideal weight.).   Medical History: Past Medical History:  Diagnosis Date  . A-fib (HCC)   . CKD (chronic kidney disease) stage 3, GFR 30-59 ml/min   . COPD (chronic obstructive pulmonary disease) (HCC)   . GERD (gastroesophageal reflux disease)   . Hyperlipidemia   . Hypertension   . IGT (impaired glucose tolerance)   . Renal disorder   . Sleep apnea   . Stroke (HCC)   . Thyroid disease     Assessment: Patient is a 81 yo female admitted to the ED for dementia. Pharmacy consulted for warfarin dosing for afib. Patient was taking Warfarin 3mg  daily prior to admission according to home med list. INR on admission is elevated and current value is 3.64.   Goal of Therapy:  INR 2-3   Plan:  Will hold warfarin for today and follow daily INR values. Will resume dosing when INR is below 3.   Clovia CuffLisa Yosselyn Tax, PharmD, BCPS 05/19/2017 9:16 AM

## 2017-05-19 NOTE — ED Notes (Signed)
Safety sitter at bedside, pt is calm and cooperative resting quietly.  Denies any needs at this time.  Pt asked why she is here today and states she said because someone said she is crazy.  Pt able to follow commands at this time.  Awaiting social work consult. No family at bedside at this time.  Respirations equal and unlabored.

## 2017-05-19 NOTE — ED Notes (Signed)
Pending placement by Social Work

## 2017-05-19 NOTE — ED Notes (Signed)
Pt bathed at this time with assistance by Gerilyn PilgrimJacob EDT.  Lotion and deodorant applied, Linens changed. Offered toileting, unable to go at this time.  Awaiting disposition plans.

## 2017-05-19 NOTE — Evaluation (Signed)
Physical Therapy Evaluation Patient Details Name: DAVELYN GWINN MRN: [id number] DOB: June 23, 1936 Today's Date: 05/19/2017   History of Present Illness  presented to ER secondary to AMS, worsening over past two weeks (per family report), noted with periods of combativeness, pending UA.  Currently calm and cooperative, though remains confused to time, situation and overall safety needs.  Clinical Impression  Upon evaluation, patient alert and oriented to self only; follows simple commands, cooperative with all evaluation components.  Bilat LEs generally weak and deconditioned, with patient reporting they feel as though they may "give out" when mobilizing.  Currently requiring min assist +1 for all mobility attempts to maintain safety/prevent LOB; unsafe to complete without hands-on assist, unsafe to return home alone at this time. Would benefit from skilled PT to address above deficits and promote optimal return to PLOF;  recommend transition to STR upon discharge from acute hospitalization.     Follow Up Recommendations SNF    Equipment Recommendations       Recommendations for Other Services       Precautions / Restrictions Precautions Precautions: Fall Restrictions Weight Bearing Restrictions: No      Mobility  Bed Mobility Overal bed mobility: Needs Assistance Bed Mobility: Supine to Sit     Supine to sit: Min assist     General bed mobility comments: heavy use of momentum to negotiate LEs over edg eof bed  Transfers Overall transfer level: Needs assistance Equipment used: Rolling walker (2 wheeled) Transfers: Sit to/from Stand Sit to Stand: Min assist         General transfer comment: constant cuing for hand placement and walker management  Ambulation/Gait Ambulation/Gait assistance: Min assist Ambulation Distance (Feet): 50 Feet Assistive device: Rolling walker (2 wheeled)       General Gait Details: inconsistent step height/length, mild staggering  with poor hip and ankle balance strategies (relies heavily on LE step strategy and UEs on RW for correction).  Limited insight into safety needs; constant hands-on assist to prevent LOB.  Stairs            Wheelchair Mobility    Modified Rankin (Stroke Patients Only)       Balance Overall balance assessment: Needs assistance Sitting-balance support: No upper extremity supported;Feet supported Sitting balance-Leahy Scale: Good     Standing balance support: Bilateral upper extremity supported Standing balance-Leahy Scale: Fair                               Pertinent Vitals/Pain Pain Assessment: No/denies pain    Home Living Family/patient expects to be discharged to:: Private residence Living Arrangements: Alone Available Help at Discharge: Family Type of Home: House Home Access: Stairs to enter Entrance Stairs-Rails: Left Entrance Stairs-Number of Steps: 5 Home Layout: One level Home Equipment: Walker - 2 wheels      Prior Function Level of Independence: Independent with assistive device(s)         Comments: Mod indep with RW for ADLs, household mobility.  Has recently been set up with HHPT, but refused to allow entrance into home (per family).  Family assists with groceries/community errands, meals, medications and finances.     Hand Dominance        Extremity/Trunk Assessment   Upper Extremity Assessment Upper Extremity Assessment: Overall WFL for tasks assessed    Lower Extremity Assessment Lower Extremity Assessment: Generalized weakness (grossly 4-/5; significant redness mid-calf distally)       Communication  Communication: No difficulties  Cognition Arousal/Alertness: Awake/alert Behavior During Therapy: WFL for tasks assessed/performed Overall Cognitive Status: Difficult to assess                                 General Comments: oriented to self and general location (as hospital) only.  Able to verbalize  simple emergency procedures, but question ability to fully maintain awareness/complete in emergency situation.      General Comments      Exercises     Assessment/Plan    PT Assessment Patient needs continued PT services  PT Problem List Decreased strength;Decreased activity tolerance;Decreased balance;Decreased mobility;Decreased coordination;Decreased cognition;Decreased knowledge of use of DME;Decreased safety awareness;Decreased knowledge of precautions       PT Treatment Interventions DME instruction;Gait training;Stair training;Functional mobility training;Therapeutic activities;Therapeutic exercise;Balance training;Patient/family education    PT Goals (Current goals can be found in the Care Plan section)  Acute Rehab PT Goals Patient Stated Goal: to get stronger PT Goal Formulation: With patient/family Time For Goal Achievement: 06/02/17 Potential to Achieve Goals: Fair    Frequency Min 2X/week   Barriers to discharge Decreased caregiver support      Co-evaluation               AM-PAC PT "6 Clicks" Daily Activity  Outcome Measure Difficulty turning over in bed (including adjusting bedclothes, sheets and blankets)?: Total Difficulty moving from lying on back to sitting on the side of the bed? : Total Difficulty sitting down on and standing up from a chair with arms (e.g., wheelchair, bedside commode, etc,.)?: Total Help needed moving to and from a bed to chair (including a wheelchair)?: A Little Help needed walking in hospital room?: A Little Help needed climbing 3-5 steps with a railing? : A Lot 6 Click Score: 11    End of Session Equipment Utilized During Treatment: Gait belt;Oxygen Activity Tolerance: Patient tolerated treatment well Patient left: in bed;with call bell/phone within reach;with bed alarm set;with family/visitor present Nurse Communication: Mobility status PT Visit Diagnosis: Muscle weakness (generalized) (M62.81);History of falling  (Z91.81);Unsteadiness on feet (R26.81)    Time: [id number] PT Time Calculation (min) (ACUTE ONLY): 26 min   Charges:   PT Evaluation $PT Eval Low Complexity: 1 Procedure     PT G Codes:   PT G-Codes **NOT FOR INPATIENT CLASS** Functional Assessment Tool Used: AM-PAC 6 Clicks Basic Mobility Functional Limitation: Mobility: Walking and moving around Mobility: Walking and Moving Around Current Status (Y7829(G8978): At least 20 percent but less than 40 percent impaired, limited or restricted Mobility: Walking and Moving Around Goal Status [phone](G8979): At least 1 percent but less than 20 percent impaired, limited or restricted    [place] H. Manson PasseyBrown, PT, DPT, NCS 05/19/17, 1:42 PM [phone]

## 2017-05-19 NOTE — ED Notes (Signed)
Dinner was set up for patient , patient eating at this time

## 2017-05-19 NOTE — Clinical Social Work Placement (Signed)
   CLINICAL SOCIAL WORK PLACEMENT  NOTE  Date:  05/19/2017  Patient Details  Name: Catherine Harris MRN: [id number] Date of Birth: 08-18-1936  Clinical Social Work is seeking post-discharge placement for this patient at the Skilled  Nursing Facility level of care (*CSW will initial, date and re-position this form in  chart as items are completed):  Yes   Patient/family provided with [place] Clinical Social Work Department's list of facilities offering this level of care within the geographic area requested by the patient (or if unable, by the patient's family).  Yes   Patient/family informed of their freedom to choose among providers that offer the needed level of care, that participate in Medicare, Medicaid or managed care program needed by the patient, have an available bed and are willing to accept the patient.  Yes   Patient/family informed of [facility] ownership interest in [facility] and [facility], as well as of the fact that they are under no obligation to receive care at these facilities.  PASRR submitted to EDS on 05/19/17     PASRR number received on 05/19/17     Existing PASRR number confirmed on       FL2 transmitted to all facilities in geographic area requested by pt/family on 05/19/17     FL2 transmitted to all facilities within larger geographic area on       Patient informed that his/her managed care company has contracts with or will negotiate with certain facilities, including the following:            Patient/family informed of bed offers received.  Patient chooses bed at       Physician recommends and patient chooses bed at      Patient to be transferred to   on  .  Patient to be transferred to facility by       Patient family notified on   of transfer.  Name of family member notified:        PHYSICIAN       Additional Comment:    _______________________________________________ Dede QuerySarah Montague Corella, LCSW 05/19/2017, 4:05 PM

## 2017-05-19 NOTE — ED Notes (Signed)
Assisted patient to the bedside commode, pt able to urinate a small amount. Patient ate 25% of lunch. Drank about 100 cc of water.  Pt performed pericare on herself when she got [facility].  Bed alarm on once patient back in bed.

## 2017-05-19 NOTE — ED Provider Notes (Signed)
-----------------------------------------   7:02 AM on 05/19/2017 -----------------------------------------   Blood pressure (!) 145/86, pulse 70, temperature 98 F (36.7 C), temperature source Oral, resp. rate (!) 26, SpO2 96 %.  The patient had no acute events since last update.  Calm and cooperative at this time.  Patient awaiting social worker evaluation     Rebecka ApleyWebster, Caide Campi P, MD 05/19/17 [phone]

## 2017-05-19 NOTE — ED Notes (Addendum)
Sitter discontinued, pt has been cooperative and calm, bed alarm placed on bed.  Respirations equal and unlabored, Ate 75% of breakfast and drank juice and water.  Pt is no longer under IVC as of last night.

## 2017-05-19 NOTE — ED Notes (Signed)
PT in room, Step-daughter at bedside as well. Updated her on the plan of care. Pt cooperative at this time.

## 2017-05-19 NOTE — ED Notes (Signed)
Spoke with SW, informed her of patient's status at this time and will complete FL2.

## 2017-05-19 NOTE — Clinical Social Work Note (Signed)
CSW consulted for New SNF. CSW spoke to pt's step-daughter to address consult. CSW introduced herself and explained role of social work. CSW also explained the process of discharging to SNF [facility]. PT recommended STR at SNF. CSW initiated SNF search and will follow up with bed offers. CSW also initiated [employer] auth. CSW emailed facility lists to pt's step-daughter as well as contact information for Weekend ED CSW.   Pt will be ready for discharge after 10 AM on Saturday, 6/23 as pt had a 1:1 sitter until 10 AM on 6/22. RN is aware.   CSW will continue to follow.   Dede QuerySarah Artha Chiasson, MSW, LCSW Clinical Social Worker  [phone]

## 2017-05-20 LAB — PROTIME-INR
INR: 4.22
Prothrombin Time: 41.8 seconds — ABNORMAL HIGH (ref 11.4–15.2)

## 2017-05-20 MED ORDER — LORAZEPAM 2 MG/ML IJ SOLN
0.5000 mg | Freq: Once | INTRAMUSCULAR | Status: AC
  Administered 2017-05-20: 0.5 mg via INTRAVENOUS
  Filled 2017-05-20: qty 1

## 2017-05-20 NOTE — ED Notes (Signed)
Pt assisted to toilet with assistance from NT [place]. Pt ambulatory without difficulty. Pt repositioned back in bed and resting comfortably now. Pt given couple more warm blankets.

## 2017-05-20 NOTE — Progress Notes (Signed)
LCSW spoke with patient's step daughter Myriam JacobsonHelen [facility] [phone].  Discussed pending [employer] insurance auth. Will not receive auth until Monday, STR bed offers, family support, private pay options, respite care, and long-term care.  Daughter has taken off of work this week and able to provide 24 hr care for patient if needed, her concerns today: Is patient stable for discharge today.  Patient [facility].  Discussed plan with MD.   Patient will discharge home with OT, PT, RN , Social Work and needed equipment (bedside commode) once medically stable and O2 is in place.   FL2 signed by MD, for possible STR or private pay for respite care with [facility] based on insurance auth. Monday.   Call from daughter she sent patient's O2 back to [facility] as patient tossed it out the door. RN care manager working on getting patient's O2 so that she will have it at discharge. There is also concern with patient's elevated BP.  Patient will remain overnight in the ED. ( see MD's note).  Plan:  LCSW will continue to follow patient until discharged home with home health services or to [facility] for respite care.  Sammuel Hineseborah Patsy Zaragoza, LCSW Licensed Clinical Social Worker [phone] 4:10 PM

## 2017-05-20 NOTE — ED Notes (Signed)
Pt attempted to get out bed. Pt states she would like some water. Pt assisted back in bed and repositioned.  Pt notified that her meal tray was coming and was given water. Bed alarm set back on bed. Pt sitting up in bed drinking her water.

## 2017-05-20 NOTE — ED Notes (Signed)
Pt states she urinated on herself.Pt cleaned up and linens changed. Pt repositioned in bed. Pt resting comfortably now.

## 2017-05-20 NOTE — Care Management Note (Signed)
Case Management Note  Patient Details  Name: Erlinda HongMary L Fuhs MRN: [id number] Date of Birth: June 24, 1936  Subjective/Objective:   Home oxygen reinstated                 Action/Plan:Spoke with Okey [facility]ol at [facility] /fax; [phone]. Order faxed and received . Oxygen to be delivered tonight .   Expected Discharge Date:                  Expected Discharge Plan:     In-House Referral:     Discharge planning Services     Post Acute Care Choice:    Choice offered to:     DME Arranged:    DME Agency:     HH Arranged:    HH Agency:     Status of Service:     If discussed at [employer] Meetings, dates discussed:    Additional Comments:  Caren MacadamMichelle Maleeya Peterkin, RN 05/20/2017, 8:13 PM

## 2017-05-20 NOTE — ED Notes (Signed)
Pt repositioned with other staff assist.

## 2017-05-20 NOTE — ED Notes (Signed)
Pt ambulatory to toilet with walker and assistance from myself and [employer] Mike. Pt changed and back in bed resting. Pt states "my legs feel better but still weak."

## 2017-05-20 NOTE — ED Notes (Signed)
Pt given medications with applesauce and some milk. Pt resting now comfortably with family at bedside.

## 2017-05-20 NOTE — ED Notes (Addendum)
Per Dr. Scotty CourtStafford Pharmist states pt doesn't need to get coudamin tonight with bedtime medications. Medication on hold at this time. Note created under medication administration

## 2017-05-20 NOTE — [place] FL2 (Signed)
[place] MEDICAID FL2 LEVEL OF CARE SCREENING TOOL     IDENTIFICATION  Patient Name: Catherine Harris Birthdate: January 30, 1936 Sex: female Admission Date (Current Location): 05/18/2017  [place]   Facility and Address:  [facility], [address], [place], [place] [address]      Provider Number: [id number]  Attending Physician Name and Address:  No att. providers found  Relative Name and Phone Number:       Current Level of Care: Hospital Recommended Level of Care: Skilled Nursing Facility Prior Approval Number:    Date Approved/Denied:   PASRR Number: [id number]  Discharge Plan: SNF    Current Diagnoses: Patient Active Problem List   Diagnosis Date Noted  . Dementia 05/18/2017    Orientation RESPIRATION BLADDER Height & Weight     Self, Place  O2 (4L) Continent Weight:   Height:     BEHAVIORAL SYMPTOMS/MOOD NEUROLOGICAL BOWEL NUTRITION STATUS      Continent Diet (Regular Diet)  AMBULATORY STATUS COMMUNICATION OF NEEDS Skin   Limited Assist Verbally Normal                       Personal Care Assistance Level of Assistance  Bathing, Feeding, Dressing Bathing Assistance: Limited assistance Feeding assistance: Independent Dressing Assistance: Limited assistance     Functional Limitations Info  Sight, Hearing, Speech Sight Info: Adequate Hearing Info: Adequate Speech Info: Adequate    SPECIAL CARE FACTORS FREQUENCY  PT (By licensed PT)     PT Frequency: 5              Contractures Contractures Info: Not present    Additional Factors Info  Code Status, Allergies Code Status Info: Full Code Allergies Info: No known allergies           Current Medications (05/20/2017):  This is the current hospital active medication list Current Facility-Administered Medications  Medication Dose Route Frequency Provider Last Rate Last Dose  . donepezil (ARICEPT) tablet 5 mg  5 mg Oral QHS Sharyn CreamerQuale,  Mark, MD      . lisinopril (PRINIVIL,ZESTRIL) tablet 5 mg  5 mg Oral Daily Sharyn CreamerQuale, Mark, MD   5 mg at 05/20/17 0939  . metoprolol tartrate (LOPRESSOR) tablet 50 mg  50 mg Oral BID Sharyn CreamerQuale, Mark, MD   50 mg at 05/20/17 0941  . pantoprazole (PROTONIX) EC tablet 40 mg  40 mg Oral Daily Sharyn CreamerQuale, Mark, MD   40 mg at 05/20/17 0939  . Warfarin - Pharmacist Dosing Inpatient   Does not apply [id number] Sharyn CreamerQuale, Mark, MD       Current Outpatient Prescriptions  Medication Sig Dispense Refill  . furosemide (LASIX) 20 MG tablet Take 20 mg by mouth daily.    Marland Kitchen. lisinopril (PRINIVIL,ZESTRIL) 5 MG tablet Take 5 mg by mouth daily.    . metoprolol tartrate (LOPRESSOR) 50 MG tablet Take 50 mg by mouth 2 (two) times daily.    Marland Kitchen. omeprazole (PRILOSEC) 20 MG capsule Take 20 mg by mouth daily.    . simvastatin (ZOCOR) 40 MG tablet Take 40 mg by mouth every evening.    . tolterodine (DETROL LA) 2 MG 24 hr capsule Take 1 capsule by mouth daily.    Marland Kitchen. warfarin (COUMADIN) 2 MG tablet Take 3 mg by mouth daily.    Marland Kitchen. donepezil (ARICEPT) 5 MG tablet Take 5 mg by mouth at bedtime.    . Multiple Vitamin (MULTI-VITAMINS) TABS Take 1 tablet by mouth daily.  Discharge Medications: Please see discharge summary for a list of discharge medications.  Relevant Imaging Results:  Relevant Lab Results:   Additional Information SSN:  [id number]  Soundra Pilon, LCSW

## 2017-05-20 NOTE — ED Notes (Signed)
Dietary notified pt's meal tray never came up. Dietary stated they would send one right up for pt.

## 2017-05-20 NOTE — ED Notes (Signed)
Pt offered applesauce. Pt states " no thank you I just want to go home"

## 2017-05-20 NOTE — Progress Notes (Signed)
ANTICOAGULATION CONSULT NOTE - Initial Consult  Pharmacy Consult for Warfarin Indication: atrial fibrillation  No Known Allergies  Patient Measurements:   Heparin Dosing Weight:   Vital Signs: BP: 190/134 (06/23 0930) Pulse Rate: 99 (06/23 0930)  Labs:  Recent Labs  05/18/17 1117 05/18/17 1316 05/19/17 0708 05/19/17 0812 05/20/17 0802  HGB 15.1  --   --   --   --   HCT 45.9  --   --   --   --   PLT 162  --   --   --   --   LABPROT 35.3*  --   --  37.1* 41.8*  INR 3.42  --   --  3.64 4.22*  CREATININE 1.43*  --  1.02*  --   --   CKTOTAL  --   --  237*  --   --   TROPONINI 0.03* 0.04*  --   --   --     CrCl cannot be calculated (Unknown ideal weight.).   Medical History: Past Medical History:  Diagnosis Date  . A-fib (HCC)   . CKD (chronic kidney disease) stage 3, GFR 30-59 ml/min   . COPD (chronic obstructive pulmonary disease) (HCC)   . GERD (gastroesophageal reflux disease)   . Hyperlipidemia   . Hypertension   . IGT (impaired glucose tolerance)   . Renal disorder   . Sleep apnea   . Stroke (HCC)   . Thyroid disease     Assessment: Patient is a 81 yo female admitted to the ED for dementia. Pharmacy consulted for warfarin dosing for afib. Patient was taking Warfarin 3mg  daily prior to admission according to home med list. INR on admission is elevated and current value is 3.64.   Goal of Therapy:  INR 2-3   Plan:  Will hold warfarin for today and follow daily INR values. Will resume dosing when INR is below 3.   Luisa HartScott Sharilynn Cassity, PharmD Clinical Pharmacist  05/20/2017 11:12 AM

## 2017-05-20 NOTE — ED Notes (Signed)
Pt offered applesauce again and agreed. Pt eating applesauce and resting comfortably

## 2017-05-20 NOTE — ED Notes (Signed)
Date and time results received: 05/20/17 0851 (use smartphrase ".now" to insert current time)  Test: PT-INR Critical Value: 4.22  Name of Provider Notified: Dr. Alphonzo LemmingsMcShane  Orders Received? Or Actions Taken?: Orders Received - See Orders for details

## 2017-05-20 NOTE — ED Provider Notes (Signed)
-----------------------------------------   3:59 PM on 05/20/2017 -----------------------------------------  Patient unable to go home today because her home oxygen was not available, blood pressures elevated but she refused her blood pressure medication social work and case management Harris working on possible placement for the patient she is in no acute distress. I have made arrangements for her possibly having her go home earlier before he realized there was no oxygen and at the time the family was interested in having her come home at this time however there is concern about the patient's ability to be taken care of. We will hold her Coumadin tonight and check INR tomorrow, this is been arranged. Patient and therefore is awaiting placement. Signed out at the end of my shift to dr. Scotty Harris.   Catherine Harris, Catherine Severt A, MD 05/20/17 1600

## 2017-05-20 NOTE — ED Notes (Signed)
Pt meal tray set up. Pt eating and resting comfortably

## 2017-05-20 NOTE — ED Notes (Signed)
Spoke with CSW Gavin PoundDeborah regarding pt's care and next steps. Per Gavin Poundeborah pt's family called and updated on next steps and pt's care to be continued at ED for time.

## 2017-05-21 LAB — CBC
HCT: 51.9 % — ABNORMAL HIGH (ref 35.0–47.0)
Hemoglobin: 16.9 g/dL — ABNORMAL HIGH (ref 12.0–16.0)
MCH: 28.6 pg (ref 26.0–34.0)
MCHC: 32.6 g/dL (ref 32.0–36.0)
MCV: 87.7 fL (ref 80.0–100.0)
PLATELETS: 186 10*3/uL (ref 150–440)
RBC: 5.92 MIL/uL — ABNORMAL HIGH (ref 3.80–5.20)
RDW: 15.3 % — ABNORMAL HIGH (ref 11.5–14.5)
WBC: 10.3 10*3/uL (ref 3.6–11.0)

## 2017-05-21 LAB — PROTIME-INR
INR: 4.05
Prothrombin Time: 40.4 seconds — ABNORMAL HIGH (ref 11.4–15.2)

## 2017-05-21 NOTE — ED Notes (Signed)
Pt and family verbalized understanding of discharge instructions and need for transfer to [facility].  NAD at this time.

## 2017-05-21 NOTE — Discharge Instructions (Signed)
Return to the emergency department immediately for any worsening condition including altered mental status, new weakness or numbness, pain, trouble breathing, or any other symptoms concerning to you.

## 2017-05-21 NOTE — ED Notes (Signed)
Gave patient food tray but refused to eat.

## 2017-05-21 NOTE — ED Provider Notes (Signed)
I was called to the bedside as the patient had bright red blood in the toilet after defecating. I examine the patient and the right inner part of her buttock was excoriated from wiping too hard. She has no hemorrhoids. She is well-appearing and hemodynamically stable.   Merrily Brittleifenbark, Samrat Hayward, MD 05/21/17 [phone]

## 2017-05-21 NOTE — ED Notes (Signed)
Pt given drink and snack.

## 2017-05-21 NOTE — Progress Notes (Signed)
ANTICOAGULATION CONSULT NOTE - Initial Consult  Pharmacy Consult for Warfarin Indication: atrial fibrillation  No Known Allergies  Patient Measurements:   Heparin Dosing Weight:   Vital Signs: Temp: 97.6 F (36.4 C) (06/24 0500) Temp Source: Oral (06/24 0500) BP: 156/93 (06/24 0800) Pulse Rate: 57 (06/24 0800)  Labs:  Recent Labs  05/18/17 1316 05/19/17 0708 05/19/17 0812 05/20/17 0802 05/21/17 0505  HGB  --   --   --   --  16.9*  HCT  --   --   --   --  51.9*  PLT  --   --   --   --  186  LABPROT  --   --  37.1* 41.8* 40.4*  INR  --   --  3.64 4.22* 4.05*  CREATININE  --  1.02*  --   --   --   CKTOTAL  --  237*  --   --   --   TROPONINI 0.04*  --   --   --   --     CrCl cannot be calculated (Unknown ideal weight.).   Medical History: Past Medical History:  Diagnosis Date  . A-fib (HCC)   . CKD (chronic kidney disease) stage 3, GFR 30-59 ml/min   . COPD (chronic obstructive pulmonary disease) (HCC)   . GERD (gastroesophageal reflux disease)   . Hyperlipidemia   . Hypertension   . IGT (impaired glucose tolerance)   . Renal disorder   . Sleep apnea   . Stroke (HCC)   . Thyroid disease     Assessment: Patient is a 81 yo female admitted to the ED for dementia. Pharmacy consulted for warfarin dosing for afib. Patient was taking Warfarin 3mg  daily prior to admission according to home med list. INR on admission is elevated.  Goal of Therapy:  INR 2-3   Plan:  Will hold warfarin for today and follow daily INR values. Will resume dosing when INR is below 3.   Luisa HartScott Briceida Rasberry, PharmD Clinical Pharmacist  05/21/2017 12:43 PM

## 2017-05-21 NOTE — ED Notes (Signed)
Helped patient to bedside toilet.

## 2017-05-21 NOTE — Progress Notes (Signed)
LCSW met with patient and step daughter Luane School at bedside.  Beth informed LCSW she is unable to take patient home as patient is 2 assist.  She understands insurance Josem Kaufmann is pending and that there is a possibility it may or may not be approved.  Dau. would like to move forward with private pay respite care at [facility]. Family is able to pay for a few days waiting for insurance auth.  Called [facility] to confirm bed is available.  Discussed discharge plan with Dr. Reita Cliche who is in agreement with the plan.  FL2 sent to facility.  Patient will take copy of AVS to facility.   LCSW provided daughter with dementia support information and a list of ALF for future options.    RN will call report to Britta Mccreedy at [facility] [phone].  Patient will go to room #509. Patient does not have portable 02 will be transported via EMS.  Casimer Lanius, LCSW Licensed Clinical Social Worker 12:03 PM

## 2017-05-21 NOTE — ED Notes (Signed)
Pt discharged with EMS for transport to [facility]. This RN spoke with Seward GraterMaggie, RN at [facility] and gave report.

## 2017-05-21 NOTE — ED Notes (Signed)
CRITICAL LAB: [facility] 4.05[facility], Dr. Lamont Snowballifenbark notified, orders received

## 2017-05-21 NOTE — ED Notes (Signed)
Blood noted in bedside toilet, Dr Lamont Snowballifenbark assessed, reports severely abraded skin in lower perineal area, pt cleaned with wipes

## 2017-05-21 NOTE — ED Provider Notes (Signed)
-----------------------------------------   7:14 AM on 05/21/2017 -----------------------------------------   Blood pressure (!) 157/108, pulse 79, temperature 97.6 F (36.4 C), temperature source Oral, resp. rate 20, SpO2 97 %.  The patient had no acute events since last update.  Calm and cooperative at this time.  Disposition is pending Psychiatry/Behavioral Medicine team recommendations.     Merrily Brittleifenbark, Balen Woolum, MD 05/21/17 [phone]

## 2017-05-21 NOTE — ED Notes (Signed)
Pt resting quietly in room with eyes closed, respirations equal and unlabored.  Breakfast tray brought into room.

## 2017-05-21 NOTE — ED Notes (Signed)
Pt assisted to bedside toilet, diaper changed, blood noted post urination, Dr Lamont Snowballifenbark notified

## 2017-06-04 ENCOUNTER — Emergency Department: Payer: Medicare Other

## 2017-06-04 ENCOUNTER — Inpatient Hospital Stay
Admission: EM | Admit: 2017-06-04 | Discharge: 2017-06-13 | DRG: 291 | Disposition: A | Discharge status: Skilled Nursing Facility | Payer: Medicare Other | Attending: Specialist | Admitting: Specialist

## 2017-06-04 ENCOUNTER — Encounter: Payer: Self-pay | Admitting: Emergency Medicine

## 2017-06-04 DIAGNOSIS — Z87891 Personal history of nicotine dependence: Secondary | ICD-10-CM

## 2017-06-04 DIAGNOSIS — L89322 Pressure ulcer of left buttock, stage 2: Secondary | ICD-10-CM | POA: Diagnosis present

## 2017-06-04 DIAGNOSIS — E039 Hypothyroidism, unspecified: Secondary | ICD-10-CM | POA: Diagnosis present

## 2017-06-04 DIAGNOSIS — K219 Gastro-esophageal reflux disease without esophagitis: Secondary | ICD-10-CM | POA: Diagnosis present

## 2017-06-04 DIAGNOSIS — I130 Hypertensive heart and chronic kidney disease with heart failure and stage 1 through stage 4 chronic kidney disease, or unspecified chronic kidney disease: Secondary | ICD-10-CM | POA: Diagnosis not present

## 2017-06-04 DIAGNOSIS — F0390 Unspecified dementia without behavioral disturbance: Secondary | ICD-10-CM | POA: Diagnosis present

## 2017-06-04 DIAGNOSIS — E785 Hyperlipidemia, unspecified: Secondary | ICD-10-CM | POA: Diagnosis present

## 2017-06-04 DIAGNOSIS — J9601 Acute respiratory failure with hypoxia: Secondary | ICD-10-CM

## 2017-06-04 DIAGNOSIS — Z7189 Other specified counseling: Secondary | ICD-10-CM

## 2017-06-04 DIAGNOSIS — R32 Unspecified urinary incontinence: Secondary | ICD-10-CM | POA: Diagnosis present

## 2017-06-04 DIAGNOSIS — I272 Pulmonary hypertension, unspecified: Secondary | ICD-10-CM | POA: Diagnosis present

## 2017-06-04 DIAGNOSIS — R0902 Hypoxemia: Secondary | ICD-10-CM

## 2017-06-04 DIAGNOSIS — Z7901 Long term (current) use of anticoagulants: Secondary | ICD-10-CM

## 2017-06-04 DIAGNOSIS — I482 Chronic atrial fibrillation, unspecified: Secondary | ICD-10-CM

## 2017-06-04 DIAGNOSIS — Z515 Encounter for palliative care: Secondary | ICD-10-CM | POA: Diagnosis not present

## 2017-06-04 DIAGNOSIS — Z8673 Personal history of transient ischemic attack (TIA), and cerebral infarction without residual deficits: Secondary | ICD-10-CM

## 2017-06-04 DIAGNOSIS — Z981 Arthrodesis status: Secondary | ICD-10-CM

## 2017-06-04 DIAGNOSIS — Z66 Do not resuscitate: Secondary | ICD-10-CM

## 2017-06-04 DIAGNOSIS — G4730 Sleep apnea, unspecified: Secondary | ICD-10-CM | POA: Diagnosis present

## 2017-06-04 DIAGNOSIS — R109 Unspecified abdominal pain: Secondary | ICD-10-CM

## 2017-06-04 DIAGNOSIS — J9621 Acute and chronic respiratory failure with hypoxia: Secondary | ICD-10-CM | POA: Diagnosis present

## 2017-06-04 DIAGNOSIS — R0602 Shortness of breath: Secondary | ICD-10-CM | POA: Diagnosis not present

## 2017-06-04 DIAGNOSIS — J440 Chronic obstructive pulmonary disease with acute lower respiratory infection: Secondary | ICD-10-CM | POA: Diagnosis present

## 2017-06-04 DIAGNOSIS — J9620 Acute and chronic respiratory failure, unspecified whether with hypoxia or hypercapnia: Secondary | ICD-10-CM

## 2017-06-04 DIAGNOSIS — F0391 Unspecified dementia with behavioral disturbance: Secondary | ICD-10-CM | POA: Diagnosis present

## 2017-06-04 DIAGNOSIS — K5900 Constipation, unspecified: Secondary | ICD-10-CM

## 2017-06-04 DIAGNOSIS — Z9981 Dependence on supplemental oxygen: Secondary | ICD-10-CM

## 2017-06-04 DIAGNOSIS — J441 Chronic obstructive pulmonary disease with (acute) exacerbation: Secondary | ICD-10-CM

## 2017-06-04 DIAGNOSIS — I248 Other forms of acute ischemic heart disease: Secondary | ICD-10-CM | POA: Diagnosis present

## 2017-06-04 DIAGNOSIS — Z79899 Other long term (current) drug therapy: Secondary | ICD-10-CM | POA: Diagnosis not present

## 2017-06-04 DIAGNOSIS — N183 Chronic kidney disease, stage 3 (moderate): Secondary | ICD-10-CM | POA: Diagnosis present

## 2017-06-04 DIAGNOSIS — J189 Pneumonia, unspecified organism: Secondary | ICD-10-CM | POA: Diagnosis present

## 2017-06-04 DIAGNOSIS — I5033 Acute on chronic diastolic (congestive) heart failure: Secondary | ICD-10-CM | POA: Diagnosis present

## 2017-06-04 DIAGNOSIS — K5904 Chronic idiopathic constipation: Secondary | ICD-10-CM | POA: Diagnosis not present

## 2017-06-04 DIAGNOSIS — R1031 Right lower quadrant pain: Secondary | ICD-10-CM | POA: Diagnosis not present

## 2017-06-04 DIAGNOSIS — L8990 Pressure ulcer of unspecified site, unspecified stage: Secondary | ICD-10-CM | POA: Insufficient documentation

## 2017-06-04 LAB — TROPONIN I
Troponin I: 0.04 ng/mL (ref <0.03)
Troponin I: 0.04 ng/mL (ref <0.03)
Troponin I: 0.04 ng/mL (ref <0.03)

## 2017-06-04 LAB — COMPREHENSIVE METABOLIC PANEL
ALBUMIN: 3.6 g/dL (ref 3.5–5.0)
ALK PHOS: 99 U/L (ref 38–126)
ALT: 20 U/L (ref 14–54)
ANION GAP: 8 (ref 5–15)
AST: 25 U/L (ref 15–41)
BUN: 25 mg/dL — ABNORMAL HIGH (ref 6–20)
CALCIUM: 9 mg/dL (ref 8.9–10.3)
CO2: 28 mmol/L (ref 22–32)
CREATININE: 1.1 mg/dL — AB (ref 0.44–1.00)
Chloride: 101 mmol/L (ref 101–111)
GFR calc Af Amer: 54 mL/min — ABNORMAL LOW (ref >60)
GFR calc non Af Amer: 47 mL/min — ABNORMAL LOW (ref >60)
GLUCOSE: 92 mg/dL (ref 65–99)
Potassium: 4.5 mmol/L (ref 3.5–5.1)
SODIUM: 137 mmol/L (ref 135–145)
Total Bilirubin: 1.9 mg/dL — ABNORMAL HIGH (ref 0.3–1.2)
Total Protein: 6.3 g/dL — ABNORMAL LOW (ref 6.5–8.1)

## 2017-06-04 LAB — CBC
HCT: 45.1 % (ref 35.0–47.0)
Hemoglobin: 14.7 g/dL (ref 12.0–16.0)
MCH: 28.2 pg (ref 26.0–34.0)
MCHC: 32.6 g/dL (ref 32.0–36.0)
MCV: 86.5 fL (ref 80.0–100.0)
Platelets: 134 10*3/uL — ABNORMAL LOW (ref 150–440)
RBC: 5.22 MIL/uL — ABNORMAL HIGH (ref 3.80–5.20)
RDW: 14.8 % — AB (ref 11.5–14.5)
WBC: 7.3 10*3/uL (ref 3.6–11.0)

## 2017-06-04 LAB — PROTIME-INR
INR: 7
PROTHROMBIN TIME: 62.6 s — AB (ref 11.4–15.2)

## 2017-06-04 LAB — MRSA PCR SCREENING: MRSA BY PCR: NEGATIVE

## 2017-06-04 MED ORDER — IPRATROPIUM-ALBUTEROL 0.5-2.5 (3) MG/3ML IN SOLN
3.0000 mL | Freq: Once | RESPIRATORY_TRACT | Status: AC
  Administered 2017-06-04: 3 mL via RESPIRATORY_TRACT
  Filled 2017-06-04: qty 3

## 2017-06-04 MED ORDER — BISACODYL 10 MG RE SUPP: 10.0000 mg | Freq: Every day | RECTAL | Status: DC | PRN

## 2017-06-04 MED ORDER — FESOTERODINE FUMARATE ER 4 MG PO TB24
4.0000 mg | ORAL_TABLET | Freq: Every day | ORAL | Status: DC
  Administered 2017-06-04 – 2017-06-13 (×10): 4 mg via ORAL
  Filled 2017-06-04 (×10): qty 1

## 2017-06-04 MED ORDER — TIOTROPIUM BROMIDE MONOHYDRATE 18 MCG IN CAPS
18.0000 ug | ORAL_CAPSULE | Freq: Every day | RESPIRATORY_TRACT | Status: DC
  Administered 2017-06-05 – 2017-06-09 (×5): 18 ug via RESPIRATORY_TRACT
  Filled 2017-06-04 (×2): qty 5

## 2017-06-04 MED ORDER — METHYLPREDNISOLONE SODIUM SUCC 125 MG IJ SOLR
60.0000 mg | Freq: Four times a day (QID) | INTRAMUSCULAR | Status: DC
  Administered 2017-06-04 – 2017-06-05 (×4): 60 mg via INTRAVENOUS
  Filled 2017-06-04 (×4): qty 2

## 2017-06-04 MED ORDER — DOCUSATE SODIUM 100 MG PO CAPS
100.0000 mg | ORAL_CAPSULE | Freq: Two times a day (BID) | ORAL | Status: DC
  Administered 2017-06-04 – 2017-06-13 (×17): 100 mg via ORAL
  Filled 2017-06-04 (×18): qty 1

## 2017-06-04 MED ORDER — MELATONIN 5 MG PO TABS
2.5000 mg | ORAL_TABLET | Freq: Every day | ORAL | Status: DC
  Administered 2017-06-04 – 2017-06-12 (×9): 2.5 mg via ORAL
  Filled 2017-06-04 (×11): qty 0.5

## 2017-06-04 MED ORDER — LEVOFLOXACIN IN D5W 750 MG/150ML IV SOLN: 750.0000 mg | INTRAVENOUS | Status: DC

## 2017-06-04 MED ORDER — DULOXETINE HCL 30 MG PO CPEP
30.0000 mg | ORAL_CAPSULE | Freq: Every day | ORAL | Status: DC
  Administered 2017-06-05 – 2017-06-13 (×9): 30 mg via ORAL
  Filled 2017-06-04 (×9): qty 1

## 2017-06-04 MED ORDER — LEVOFLOXACIN IN D5W 750 MG/150ML IV SOLN
750.0000 mg | Freq: Once | INTRAVENOUS | Status: AC
  Administered 2017-06-04: 750 mg via INTRAVENOUS
  Filled 2017-06-04: qty 150

## 2017-06-04 MED ORDER — WARFARIN SODIUM 3 MG PO TABS: 3.0000 mg | ORAL_TABLET | Freq: Every day | ORAL | Status: DC

## 2017-06-04 MED ORDER — IPRATROPIUM-ALBUTEROL 0.5-2.5 (3) MG/3ML IN SOLN
3.0000 mL | Freq: Four times a day (QID) | RESPIRATORY_TRACT | Status: DC
  Administered 2017-06-04 – 2017-06-07 (×11): 3 mL via RESPIRATORY_TRACT
  Filled 2017-06-04 (×11): qty 3

## 2017-06-04 MED ORDER — LISINOPRIL 5 MG PO TABS
5.0000 mg | ORAL_TABLET | Freq: Every day | ORAL | Status: DC
  Administered 2017-06-04 – 2017-06-13 (×10): 5 mg via ORAL
  Filled 2017-06-04 (×10): qty 1

## 2017-06-04 MED ORDER — ACETAMINOPHEN 325 MG PO TABS
650.0000 mg | ORAL_TABLET | Freq: Four times a day (QID) | ORAL | Status: DC | PRN
  Administered 2017-06-09 – 2017-06-10 (×2): 650 mg via ORAL
  Filled 2017-06-04 (×2): qty 2

## 2017-06-04 MED ORDER — NITROFURANTOIN MONOHYD MACRO 100 MG PO CAPS
100.0000 mg | ORAL_CAPSULE | Freq: Two times a day (BID) | ORAL | Status: DC
  Administered 2017-06-04: 100 mg via ORAL
  Filled 2017-06-04 (×3): qty 1

## 2017-06-04 MED ORDER — MOMETASONE FURO-FORMOTEROL FUM 200-5 MCG/ACT IN AERO
2.0000 | INHALATION_SPRAY | Freq: Two times a day (BID) | RESPIRATORY_TRACT | Status: DC
  Administered 2017-06-05 – 2017-06-13 (×16): 2 via RESPIRATORY_TRACT
  Filled 2017-06-04 (×2): qty 8.8

## 2017-06-04 MED ORDER — ONDANSETRON HCL 4 MG PO TABS: 4.0000 mg | ORAL_TABLET | Freq: Four times a day (QID) | ORAL | Status: DC | PRN

## 2017-06-04 MED ORDER — SODIUM CHLORIDE 0.9 % IV SOLN
INTRAVENOUS | Status: DC
  Administered 2017-06-04 – 2017-06-06 (×4): via INTRAVENOUS
  Administered 2017-06-06: 75 mL/h via INTRAVENOUS
  Administered 2017-06-07 (×2): via INTRAVENOUS

## 2017-06-04 MED ORDER — MONTELUKAST SODIUM 10 MG PO TABS
10.0000 mg | ORAL_TABLET | Freq: Every day | ORAL | Status: DC
  Administered 2017-06-04 – 2017-06-12 (×9): 10 mg via ORAL
  Filled 2017-06-04 (×9): qty 1

## 2017-06-04 MED ORDER — SIMVASTATIN 20 MG PO TABS
20.0000 mg | ORAL_TABLET | Freq: Every evening | ORAL | Status: DC
  Administered 2017-06-04 – 2017-06-12 (×9): 20 mg via ORAL
  Filled 2017-06-04 (×10): qty 1

## 2017-06-04 MED ORDER — ACETAMINOPHEN 650 MG RE SUPP: 650.0000 mg | Freq: Four times a day (QID) | RECTAL | Status: DC | PRN

## 2017-06-04 MED ORDER — FUROSEMIDE 20 MG PO TABS
20.0000 mg | ORAL_TABLET | Freq: Every day | ORAL | Status: DC
  Administered 2017-06-05 – 2017-06-07 (×3): 20 mg via ORAL
  Filled 2017-06-04 (×3): qty 1

## 2017-06-04 MED ORDER — TRAZODONE HCL 50 MG PO TABS
50.0000 mg | ORAL_TABLET | Freq: Every day | ORAL | Status: DC
  Administered 2017-06-04 – 2017-06-12 (×9): 50 mg via ORAL
  Filled 2017-06-04 (×10): qty 1

## 2017-06-04 MED ORDER — DONEPEZIL HCL 5 MG PO TABS
5.0000 mg | ORAL_TABLET | Freq: Every day | ORAL | Status: DC
  Administered 2017-06-04 – 2017-06-12 (×9): 5 mg via ORAL
  Filled 2017-06-04 (×11): qty 1

## 2017-06-04 MED ORDER — ONDANSETRON HCL 4 MG/2ML IJ SOLN
4.0000 mg | Freq: Four times a day (QID) | INTRAMUSCULAR | Status: DC | PRN
  Administered 2017-06-09: 4 mg via INTRAVENOUS
  Filled 2017-06-04: qty 2

## 2017-06-04 MED ORDER — FUROSEMIDE 10 MG/ML IJ SOLN
40.0000 mg | Freq: Once | INTRAMUSCULAR | Status: AC
  Administered 2017-06-04: 40 mg via INTRAVENOUS
  Filled 2017-06-04: qty 4

## 2017-06-04 MED ORDER — FAMOTIDINE IN NACL 20-0.9 MG/50ML-% IV SOLN
20.0000 mg | Freq: Two times a day (BID) | INTRAVENOUS | Status: DC
  Administered 2017-06-04 (×2): 20 mg via INTRAVENOUS
  Filled 2017-06-04 (×2): qty 50

## 2017-06-04 MED ORDER — METOPROLOL TARTRATE 50 MG PO TABS
50.0000 mg | ORAL_TABLET | Freq: Two times a day (BID) | ORAL | Status: DC
  Administered 2017-06-04 – 2017-06-13 (×18): 50 mg via ORAL
  Filled 2017-06-04 (×19): qty 1

## 2017-06-04 MED ORDER — PANTOPRAZOLE SODIUM 40 MG PO TBEC
40.0000 mg | DELAYED_RELEASE_TABLET | Freq: Every day | ORAL | Status: DC
  Administered 2017-06-05 – 2017-06-13 (×9): 40 mg via ORAL
  Filled 2017-06-04 (×9): qty 1

## 2017-06-04 MED ORDER — LORAZEPAM 2 MG/ML IJ SOLN: 0.5000 mg | INTRAMUSCULAR | Status: DC | PRN

## 2017-06-04 MED ORDER — METHYLPREDNISOLONE SODIUM SUCC 125 MG IJ SOLR
125.0000 mg | Freq: Once | INTRAMUSCULAR | Status: AC
  Administered 2017-06-04: 125 mg via INTRAVENOUS
  Filled 2017-06-04: qty 2

## 2017-06-04 MED ORDER — ENOXAPARIN SODIUM 40 MG/0.4ML ~~LOC~~ SOLN
40.0000 mg | SUBCUTANEOUS | Status: DC
  Administered 2017-06-04: 40 mg via SUBCUTANEOUS
  Filled 2017-06-04: qty 0.4

## 2017-06-04 MED ORDER — MORPHINE SULFATE (PF) 2 MG/ML IV SOLN: 2.0000 mg | INTRAVENOUS | Status: DC | PRN

## 2017-06-04 NOTE — Consult Note (Signed)
[facility] Critical Care Medicine Consultation   ASSESSMENT/PLAN   Respiratory distress.   Patient with prior history of COPD, cardiac disease, atrial fibrillation. Chest x-ray reveals lymphangitic edema along with left basilar consolidation. Mixed heart failure / pneumonia. Agree with broad-spectrum empiric coverage, albuterol, Atrovent, Solu-Medrol, FiO2 keep oxygen saturation greater than 90%. If blood pressure will allow empiric trial of diuretics. , EKG, serial cardiac enzymes, echocardiography  Positive troponin at 0.04. Will check EKG and serial cardiac enzymes  Atrial fibrillation. We'll monitor ventricular response  Chronic renal insufficiency. Stable  Dementia. Patient presently awake alert but confused  Prior history of CVA   Name: Catherine Harris MRN: [id number] DOB: [birth year]    ADMISSION DATE:  06/04/2017 CONSULTATION DATE:  06/04/2017  REFERRING MD :  Hospitalist  CHIEF COMPLAINT:  Shortness of breath   HISTORY OF PRESENT ILLNESS:  Mrs. Catherine Harris is a 81 year old female with a past medical history remarkable for dementia COPD, hypertension, hyperlipidemia, chronic renal insufficiency, prior CVA, atrial fibrillation, hypothyroidism, was at skilled nursing facility/rehabilitation when patient was noted to be hypoxemic. She recently has had issues with urinary tract infection, nausea, vomiting and diarrhea. She also has had some cough and congestion. Denies productive sputum, fever, chills or sweats. In emergency department she was noted to be hypoxemic presently required Venturi mask. She is on 2 L of oxygen at baseline. Presently she is saturating well at 93%, is communicating without any difficulty or double breathing. She is awake alert but confused. Presently in atrial fibrillation with ventricular response of 110  PAST MEDICAL HISTORY :  Past Medical History:  Diagnosis Date  . A-fib (HCC)   . CKD (chronic kidney disease) stage 3, GFR 30-59 ml/min   . COPD  (chronic obstructive pulmonary disease) (HCC)   . GERD (gastroesophageal reflux disease)   . Hyperlipidemia   . Hypertension   . IGT (impaired glucose tolerance)   . Renal disorder   . Sleep apnea   . Stroke (HCC)   . Thyroid disease    Past Surgical History:  Procedure Laterality Date  . ABDOMINAL HYSTERECTOMY    . BLADDER SURGERY    . CATARACT EXTRACTION    . LAMINECTOMY     Prior to Admission medications   Medication Sig Start Date End Date Taking? Authorizing Provider  donepezil (ARICEPT) 5 MG tablet Take 5 mg by mouth at bedtime.   Yes [provider]  DULoxetine (CYMBALTA) 30 MG capsule Take 30 mg by mouth daily.   Yes [provider]  furosemide (LASIX) 20 MG tablet Take 20 mg by mouth daily. 05/08/17  Yes [provider]  lisinopril (PRINIVIL,ZESTRIL) 5 MG tablet Take 5 mg by mouth daily. 03/17/17  Yes [provider]  Melatonin 3 MG TABS Take 3 mg by mouth at bedtime.   Yes [provider]  metoprolol tartrate (LOPRESSOR) 50 MG tablet Take 50 mg by mouth 2 (two) times daily. 02/23/17  Yes [provider]  Multiple Vitamin (MULTI-VITAMINS) TABS Take 1 tablet by mouth daily.   Yes [provider]  nitrofurantoin, macrocrystal-monohydrate, (MACROBID) 100 MG capsule Take 100 mg by mouth 2 (two) times daily. 05/31/17 06/05/17 Yes [provider]  omeprazole (PRILOSEC) 20 MG capsule Take 20 mg by mouth daily. 03/17/17  Yes [provider]  simvastatin (ZOCOR) 20 MG tablet Take 20 mg by mouth every evening.  03/10/17  Yes [provider]  tolterodine (DETROL LA) 2 MG 24 hr capsule Take 1 capsule by mouth daily.  11/22/16  Yes [provider]  traZODone (DESYREL) 50 MG tablet Take 50 mg by mouth at bedtime.   Yes [provider]  warfarin (COUMADIN) 3 MG tablet Take 3 mg by mouth daily. 03/17/17  Yes [provider]   No Known Allergies  FAMILY HISTORY:  History reviewed. No  pertinent family history. SOCIAL HISTORY:  reports that she has quit smoking. She has never used smokeless tobacco. Her alcohol and drug histories are not on file.  REVIEW OF SYSTEMS:   Unable to obtain review of systems secondary to patient's underlying dementia  VITAL SIGNS: Temp:  [99 F (37.2 C)] 99 F (37.2 C) (07/08 1125) Pulse Rate:  [74-96] 85 (07/08 1330) Resp:  [19-30] 27 (07/08 1330) BP: (43-148)/(20-94) 138/90 (07/08 1330) SpO2:  [87 %-96 %] 92 % (07/08 1330) FiO2 (%):  [55 %] 55 % (07/08 1130) Weight:  [79.5 kg (175 lb 4.3 oz)] 79.5 kg (175 lb 4.3 oz) (07/08 1126) HEMODYNAMICS:   VENTILATOR SETTINGS: FiO2 (%):  [55 %] 55 % INTAKE / OUTPUT: No intake or output data in the 24 hours ending 06/04/17 1418  Physical Examination:   VS: BP 138/90   Pulse 85   Temp 99 F (37.2 C) (Oral)   Resp (!) 27   Ht 5\' 5"  (1.651 m)   Wt 79.5 kg (175 lb 4.3 oz)   SpO2 92%   BMI 29.17 kg/m   General Appearance: Elderly female, mild respiratory distress Neuro: Patient is awake, alert, confused. HEENT: Trachea is midline, no oral lesions appreciated,  Pulmonary: Crackles appreciated bilaterally, prolonged expiratory phase with expiratory rhonchi noted. Cardiovascular irregularly irregular rhythm    Abdomen: Benign, Soft, non-tender, No masses Renal:  No costovertebral tenderness  GU:  Not performed at this time. Endoc: No evident thyromegaly, no signs of acromegaly. Extremities: Patient has mild lower extremity edema bilateral    LABS: Reviewed   LABORATORY PANEL:   CBC  Recent Labs Lab 06/04/17 1132  WBC 7.3  HGB 14.7  HCT 45.1  PLT 134*    Chemistries   Recent Labs Lab 06/04/17 1132  NA 137  K 4.5  CL 101  CO2 28  GLUCOSE 92  BUN 25*  CREATININE 1.10*  CALCIUM 9.0  AST 25  ALT 20  ALKPHOS 99  BILITOT 1.9*    No results for input(s): GLUCAP in the last 168 hours. No results for input(s): PHART, PCO2ART, PO2ART in the last 168 hours.  Recent  Labs Lab 06/04/17 1132  AST 25  ALT 20  ALKPHOS 99  BILITOT 1.9*  ALBUMIN 3.6    Cardiac Enzymes  Recent Labs Lab 06/04/17 1132  TROPONINI 0.04*    RADIOLOGY:  Dg Chest 2 View  Result Date: 06/04/2017 CLINICAL DATA:  COPD.  Shortness of breath. EXAM: CHEST  2 VIEW COMPARISON:  05/18/2017 FINDINGS: Lungs are hyperexpanded. Interstitial markings are diffusely coarsened with chronic features. The cardio pericardial silhouette is enlarged. New left base collapse/consolidation with small left pleural effusion. Bones are diffusely demineralized. IMPRESSION: Emphysema with left base collapse/ consolidation and small effusion. Imaging features may be related to pneumonia, but close follow-up recommended to ensure resolution. Electronically Signed   By: Kennith Center M.D.   On: 06/04/2017 11:56     Tora Kindred, DO ICU Pager [phone] [facility] Office Number: [phone]  Santiago Glad, M.D.  Billy Fischer, M.D   06/04/2017, 2:18 PM

## 2017-06-04 NOTE — Progress Notes (Signed)
Pharmacy Antibiotic Note  Erlinda HongMary L Arterburn is a 81 y.o. female admitted on 06/04/2017 with HCAP.  Pharmacy has been consulted for levofloxacin dosing.  Plan: Levofloxacin 750 mg IV Q48H  Height: 5\' 5"  (165.1 cm) Weight: 175 lb 4.3 oz (79.5 kg) IBW/kg (Calculated) : 57  Temp (24hrs), Avg:99 F (37.2 C), Min:99 F (37.2 C), Max:99 F (37.2 C)   Recent Labs Lab 06/04/17 1132  WBC 7.3  CREATININE 1.10*    Estimated Creatinine Clearance: 43.9 mL/min (A) (by C-G formula based on SCr of 1.1 mg/dL (H)).    No Known Allergies  Thank you for allowing pharmacy to be a part of this patient's care.  Carola FrostNathan A Channah Godeaux, Pharm.D., BCPS Clinical Pharmacist 06/04/2017 2:01 PM

## 2017-06-04 NOTE — H&P (Signed)
History and Physical    [name]:[id number] DOB: 12/21/1935 DOA: 06/04/2017  Referring physician: Dr. Derrill Kay PCP: System, Pcp Not In  Specialists: none  Chief Complaint: SOB  HPI: Catherine Harris is a 81 y.o. female has a past medical history significant for COPD, A-fib, dementia and chronic respiratory failure on 2L O2 at SNF now with worsening SOB and weakness with DOE. No fever. Slight cough. In ER, pt markedly hypoxic require O2 via FM. CXR shows LLL pneumonia. She is now admitted. Pt is slightly confused in ER and poor historian. Family unsure of recent events but family does state that pt does not have a DNR.  Review of Systems: The patient denies anorexia, fever, weight loss,, vision loss, decreased hearing, hoarseness,  syncope,peripheral edema, balance deficits, hemoptysis, abdominal pain, melena, hematochezia, severe indigestion/heartburn, hematuria, incontinence, genital sores, muscle weakness, suspicious skin lesions, transient blindness, difficulty walking, depression, unusual weight change, abnormal bleeding, enlarged lymph nodes, angioedema, and breast masses.   Past Medical History:  Diagnosis Date  . A-fib (HCC)   . CKD (chronic kidney disease) stage 3, GFR 30-59 ml/min   . COPD (chronic obstructive pulmonary disease) (HCC)   . GERD (gastroesophageal reflux disease)   . Hyperlipidemia   . Hypertension   . IGT (impaired glucose tolerance)   . Renal disorder   . Sleep apnea   . Stroke (HCC)   . Thyroid disease    Past Surgical History:  Procedure Laterality Date  . ABDOMINAL HYSTERECTOMY    . BLADDER SURGERY    . CATARACT EXTRACTION    . LAMINECTOMY     Social History:  reports that she has quit smoking. She has never used smokeless tobacco. Her alcohol and drug histories are not on file.  No Known Allergies  History reviewed. No pertinent family history.  Prior to Admission medications   Medication Sig Start Date End Date Taking?  Authorizing Provider  donepezil (ARICEPT) 5 MG tablet Take 5 mg by mouth at bedtime.   Yes [provider]  DULoxetine (CYMBALTA) 30 MG capsule Take 30 mg by mouth daily.   Yes [provider]  furosemide (LASIX) 20 MG tablet Take 20 mg by mouth daily. 05/08/17  Yes [provider]  lisinopril (PRINIVIL,ZESTRIL) 5 MG tablet Take 5 mg by mouth daily. 03/17/17  Yes [provider]  Melatonin 3 MG TABS Take 3 mg by mouth at bedtime.   Yes [provider]  metoprolol tartrate (LOPRESSOR) 50 MG tablet Take 50 mg by mouth 2 (two) times daily. 02/23/17  Yes [provider]  Multiple Vitamin (MULTI-VITAMINS) TABS Take 1 tablet by mouth daily.   Yes [provider]  nitrofurantoin, macrocrystal-monohydrate, (MACROBID) 100 MG capsule Take 100 mg by mouth 2 (two) times daily. 05/31/17 06/05/17 Yes [provider]  omeprazole (PRILOSEC) 20 MG capsule Take 20 mg by mouth daily. 03/17/17  Yes [provider]  simvastatin (ZOCOR) 20 MG tablet Take 20 mg by mouth every evening.  03/10/17  Yes [provider]  tolterodine (DETROL LA) 2 MG 24 hr capsule Take 1 capsule by mouth daily. 11/22/16  Yes [provider]  traZODone (DESYREL) 50 MG tablet Take 50 mg by mouth at bedtime.   Yes [provider]  warfarin (COUMADIN) 3 MG tablet Take 3 mg by mouth daily. 03/17/17  Yes [provider]   Physical Exam: Vitals:   06/04/17 1300 06/04/17 1315 06/04/17 1330 06/04/17 1400  BP: (!) 142/80 137/75 138/90  Pulse: 92 96 85   Resp: (!) 25 (!) 21 (!) 27   Temp:      TempSrc:      SpO2: 95% 94% 92%   Weight:      Height:    5\' 5"  (1.651 m)     General:  WDWN, [place]/AT, acutely ill appearing in moderate distress  Eyes: PERRL, EOMI, no scleral icterus, conjunctiva clear  ENT: moist oropharynx without exudate, TM's benign, dentition fair  Neck: supple, no lymphadenopathy. No bruits or  thyromegaly  Cardiovascular: irregularly irregular with rapid rate without MRG; 2+ peripheral pulses, no JVD, 1+ peripheral edema  Respiratory: decreased breath sounds with dullness at left base. Diffuse rhonchi, No wheezes. Respiratory effort increased  Abdomen: soft, non tender to palpation, positive bowel sounds, no guarding, no rebound  Skin: no rashes or lesions  Musculoskeletal: normal bulk and tone, no joint swelling  Psychiatric: normal mood and affect, A&OX3(not to time)  Neurologic: CN 2-12 grossly intact, Motor strength 5/5 in all 4 groups with symmetric DTR's and non-focal sensory exam  Labs on Admission:  Basic Metabolic Panel:  Recent Labs Lab 06/04/17 1132  NA 137  K 4.5  CL 101  CO2 28  GLUCOSE 92  BUN 25*  CREATININE 1.10*  CALCIUM 9.0   Liver Function Tests:  Recent Labs Lab 06/04/17 1132  AST 25  ALT 20  ALKPHOS 99  BILITOT 1.9*  PROT 6.3*  ALBUMIN 3.6   No results for input(s): LIPASE, AMYLASE in the last 168 hours. No results for input(s): AMMONIA in the last 168 hours. CBC:  Recent Labs Lab 06/04/17 1132  WBC 7.3  HGB 14.7  HCT 45.1  MCV 86.5  PLT 134*   Cardiac Enzymes:  Recent Labs Lab 06/04/17 1132  TROPONINI 0.04*    BNP (last 3 results) No results for input(s): BNP in the last 8760 hours.  ProBNP (last 3 results) No results for input(s): PROBNP in the last 8760 hours.  CBG: No results for input(s): GLUCAP in the last 168 hours.  Radiological Exams on Admission: Dg Chest 2 View  Result Date: 06/04/2017 CLINICAL DATA:  COPD.  Shortness of breath. EXAM: CHEST  2 VIEW COMPARISON:  05/18/2017 FINDINGS: Lungs are hyperexpanded. Interstitial markings are diffusely coarsened with chronic features. The cardio pericardial silhouette is enlarged. New left base collapse/consolidation with small left pleural effusion. Bones are diffusely demineralized. IMPRESSION: Emphysema with left base collapse/ consolidation and small  effusion. Imaging features may be related to pneumonia, but close follow-up recommended to ensure resolution. Electronically Signed   By: Kennith CenterEric  Mansell M.D.   On: 06/04/2017 11:56    EKG: Independently reviewed.  Assessment/Plan Principal Problem:   Acute on chronic respiratory failure (HCC) Active Problems:   HCAP (healthcare-associated pneumonia)   Atrial fibrillation, chronic (HCC)   COPD exacerbation (HCC)   Will admit to Stepdown on FM O2 as FULL CODE. Begin IV ABX with SVN's and IV steroids. Consult the ICU service/Pulmonology. Telemetry. Follow enzymes. Repeat labs in AM. Echo ordered  Diet: clear liquids Fluids: NS@75  DVT Prophylaxis: Lovenox  Code Status: FULL  Family Communication: yes  Disposition Plan: SNF  Time spent: 55 min

## 2017-06-04 NOTE — ED Provider Notes (Signed)
[facility] Emergency Department Provider Note  ____________________________________________   I have reviewed the triage vital signs and the nursing notes.   HISTORY  Chief Complaint Shortness of breath  History limited by: Dementia   HPI Catherine Harris is a 81 y.o. female who presents to the emergency department today via EMS because of concerns for shortness of breath. Unfortunately the patient cannot give me any significant history. EMS states she is coming from [facility]. Apparently nursing staff there had oxygen saturation is in the 70s however when EMS started checking it was in the 90s. She was given albuterol during transfer. Does have a history of COPD and EMS did appreciate some wheezing.   Past Medical History:  Diagnosis Date  . A-fib (HCC)   . CKD (chronic kidney disease) stage 3, GFR 30-59 ml/min   . COPD (chronic obstructive pulmonary disease) (HCC)   . GERD (gastroesophageal reflux disease)   . Hyperlipidemia   . Hypertension   . IGT (impaired glucose tolerance)   . Renal disorder   . Sleep apnea   . Stroke (HCC)   . Thyroid disease     Patient Active Problem List   Diagnosis Date Noted  . Dementia 05/18/2017    Past Surgical History:  Procedure Laterality Date  . ABDOMINAL HYSTERECTOMY    . BLADDER SURGERY    . CATARACT EXTRACTION    . LAMINECTOMY      Prior to Admission medications   Medication Sig Start Date End Date Taking? Authorizing Provider  donepezil (ARICEPT) 5 MG tablet Take 5 mg by mouth at bedtime.    [provider]  furosemide (LASIX) 20 MG tablet Take 20 mg by mouth daily. 05/08/17   [provider]  lisinopril (PRINIVIL,ZESTRIL) 5 MG tablet Take 5 mg by mouth daily. 03/17/17   [provider]  metoprolol tartrate (LOPRESSOR) 50 MG tablet Take 50 mg by mouth 2 (two) times daily. 02/23/17   [provider]  Multiple Vitamin (MULTI-VITAMINS) TABS Take 1 tablet by mouth  daily.    [provider]  omeprazole (PRILOSEC) 20 MG capsule Take 20 mg by mouth daily. 03/17/17   [provider]  simvastatin (ZOCOR) 40 MG tablet Take 40 mg by mouth every evening. 03/10/17   [provider]  tolterodine (DETROL LA) 2 MG 24 hr capsule Take 1 capsule by mouth daily. 11/22/16   [provider]  warfarin (COUMADIN) 2 MG tablet Take 3 mg by mouth daily. 03/17/17   [provider]    Allergies Patient has no known allergies.  No family history on file.  Social History Social History  Substance Use Topics  . Smoking status: Former Games developermoker  . Smokeless tobacco: Not on file  . Alcohol use Not on file  Current resident at [facility].  Review of Systems Unable to obtain given dementia  ____________________________________________   PHYSICAL EXAM:  VITAL SIGNS: ED Triage Vitals  Enc Vitals Group     BP 133/94     Pulse 88     Resp 30     Temp 99     Temp src      SpO2 87    Constitutional: Awake and alert. Disoriented to events.  Eyes: Conjunctivae are normal.  ENT   Head: Normocephalic and atraumatic.   Nose: No congestion/rhinnorhea.   Mouth/Throat: Mucous membranes are moist.   Neck: No stridor. Hematological/Lymphatic/Immunilogical: No cervical lymphadenopathy. Cardiovascular: Tachycardic, irregular rhythm.  No murmurs, rubs, or gallops.  Respiratory: Tachypnea, wheezing in bilateral bases. Gastrointestinal: Soft and non tender. No rebound. No guarding.  Genitourinary: Deferred Musculoskeletal: Normal range of motion in all extremities. No lower extremity edema. Neurologic:  Normal speech and language. No gross focal neurologic deficits are appreciated.  Skin:  Skin is warm, dry and intact. No rash noted. Psychiatric: Mood and affect are normal. Speech and behavior are normal. Patient exhibits appropriate insight and judgment.  ____________________________________________    LABS  (pertinent positives/negatives)  Labs Reviewed  CBC - Abnormal; Notable for the following:       Result Value   RBC 5.22 (*)    RDW 14.8 (*)    Platelets 134 (*)    All other components within normal limits  TROPONIN I - Abnormal; Notable for the following:    Troponin I 0.04 (*)    All other components within normal limits  COMPREHENSIVE METABOLIC PANEL - Abnormal; Notable for the following:    BUN 25 (*)    Creatinine, Ser 1.10 (*)    Total Protein 6.3 (*)    Total Bilirubin 1.9 (*)    GFR calc non Af Amer 47 (*)    GFR calc Af Amer 54 (*)    All other components within normal limits  CULTURE, BLOOD (ROUTINE X 2)  CULTURE, BLOOD (ROUTINE X 2)     ____________________________________________   EKG  I, Phineas Semen, attending physician, personally viewed and interpreted this EKG  EKG Time: 1407 Rate: 93 Rhythm: atrial fibrillation Axis: right axis deviation Intervals: qtc 475 QRS: narrow ST changes: no st elevation Impression: abnormal ekg   ____________________________________________    RADIOLOGY  CXR IMPRESSION:  Emphysema with left base collapse/ consolidation and small effusion.  Imaging features may be related to pneumonia, but close follow-up  recommended to ensure resolution.     ____________________________________________   PROCEDURES  Procedures  ____________________________________________   INITIAL IMPRESSION / ASSESSMENT AND PLAN / ED COURSE  Pertinent labs & imaging results that were available during my care of the patient were reviewed by me and considered in my medical decision making (see chart for details).  Patient presented to the emergency department today because of concern for shortness of breath. Patient was placed on venturi mask to help raise oxygen levels into the low nineties. The patient's cxr was concerning for pneumonia. Will give IV abx and plan on  admission.  ____________________________________________   FINAL CLINICAL IMPRESSION(S) / ED DIAGNOSES  Final diagnoses:  Shortness of breath  Hypoxia  Community acquired pneumonia, unspecified laterality     Note: This dictation was prepared with Office manager. Any transcriptional errors that result from this process are unintentional     Phineas Semen, MD 06/04/17 1418

## 2017-06-04 NOTE — ED Triage Notes (Signed)
Patient from [facility]. EMS Reports: Staff c/o initial SPO2 on 2l "in the 70's" with accessory muscle use. EMS arrival indicated spo2 "in the 90's".  7.5 Albuterol administered enroute CO2 14

## 2017-06-05 ENCOUNTER — Inpatient Hospital Stay
Admit: 2017-06-05 | Discharge: 2017-06-05 | Disposition: A | Discharge status: Home / Self Care | Payer: Medicare Other | Attending: Cardiovascular Disease | Admitting: Cardiovascular Disease

## 2017-06-05 ENCOUNTER — Inpatient Hospital Stay: Payer: Medicare Other

## 2017-06-05 LAB — COMPREHENSIVE METABOLIC PANEL
ALBUMIN: 3.2 g/dL — AB (ref 3.5–5.0)
ALK PHOS: 84 U/L (ref 38–126)
ALT: 17 U/L (ref 14–54)
ANION GAP: 9 (ref 5–15)
AST: 23 U/L (ref 15–41)
BILIRUBIN TOTAL: 1.5 mg/dL — AB (ref 0.3–1.2)
BUN: 25 mg/dL — ABNORMAL HIGH (ref 6–20)
CALCIUM: 8.6 mg/dL — AB (ref 8.9–10.3)
CO2: 27 mmol/L (ref 22–32)
Chloride: 101 mmol/L (ref 101–111)
Creatinine, Ser: 1.03 mg/dL — ABNORMAL HIGH (ref 0.44–1.00)
GFR, EST AFRICAN AMERICAN: 59 mL/min — AB (ref >60)
GFR, EST NON AFRICAN AMERICAN: 51 mL/min — AB (ref >60)
Glucose, Bld: 161 mg/dL — ABNORMAL HIGH (ref 65–99)
POTASSIUM: 4.3 mmol/L (ref 3.5–5.1)
Sodium: 137 mmol/L (ref 135–145)
Total Protein: 6 g/dL — ABNORMAL LOW (ref 6.5–8.1)

## 2017-06-05 LAB — CBC
HEMATOCRIT: 43.6 % (ref 35.0–47.0)
HEMOGLOBIN: 14.7 g/dL (ref 12.0–16.0)
MCH: 28.9 pg (ref 26.0–34.0)
MCHC: 33.6 g/dL (ref 32.0–36.0)
MCV: 86 fL (ref 80.0–100.0)
Platelets: 118 10*3/uL — ABNORMAL LOW (ref 150–440)
RBC: 5.07 MIL/uL (ref 3.80–5.20)
RDW: 14.8 % — ABNORMAL HIGH (ref 11.5–14.5)
WBC: 3.5 10*3/uL — AB (ref 3.6–11.0)

## 2017-06-05 LAB — PROTIME-INR
INR: 9.58 — AB
INR: 9.69 — AB
INR: 2.01
PROTHROMBIN TIME: 80.5 s — AB (ref 11.4–15.2)
PROTHROMBIN TIME: 81.2 s — AB (ref 11.4–15.2)
PROTHROMBIN TIME: 23.1 s — AB (ref 11.4–15.2)

## 2017-06-05 LAB — GLUCOSE, CAPILLARY
GLUCOSE-CAPILLARY: 133 mg/dL — AB (ref 65–99)
GLUCOSE-CAPILLARY: 179 mg/dL — AB (ref 65–99)
GLUCOSE-CAPILLARY: 167 mg/dL — AB (ref 65–99)
Glucose-Capillary: 185 mg/dL — ABNORMAL HIGH (ref 65–99)

## 2017-06-05 LAB — PROCALCITONIN: Procalcitonin: <0.1 ng/mL

## 2017-06-05 LAB — TROPONIN I: Troponin I: 0.04 ng/mL (ref <0.03)

## 2017-06-05 MED ORDER — VITAMIN K1 10 MG/ML IJ SOLN
10.0000 mg | Freq: Once | INTRAVENOUS | Status: AC
  Administered 2017-06-05: 10 mg via INTRAVENOUS
  Filled 2017-06-05: qty 1

## 2017-06-05 MED ORDER — FAMOTIDINE 20 MG PO TABS
20.0000 mg | ORAL_TABLET | Freq: Every day | ORAL | Status: DC
  Administered 2017-06-06 – 2017-06-09 (×4): 20 mg via ORAL
  Filled 2017-06-05 (×4): qty 1

## 2017-06-05 MED ORDER — DEXTROSE 5 % IV SOLN
500.0000 mg | INTRAVENOUS | Status: DC
  Administered 2017-06-05: 500 mg via INTRAVENOUS
  Filled 2017-06-05: qty 500

## 2017-06-05 MED ORDER — METHYLPREDNISOLONE SODIUM SUCC 40 MG IJ SOLR
20.0000 mg | Freq: Every day | INTRAMUSCULAR | Status: DC
  Administered 2017-06-06 – 2017-06-07 (×2): 20 mg via INTRAVENOUS
  Filled 2017-06-05 (×2): qty 1

## 2017-06-05 MED ORDER — DEXTROSE 5 % IV SOLN
500.0000 mg | INTRAVENOUS | Status: DC
  Filled 2017-06-05: qty 500

## 2017-06-05 MED ORDER — WARFARIN - PHARMACIST DOSING INPATIENT: Freq: Every day | Status: DC

## 2017-06-05 MED ORDER — WARFARIN SODIUM 3 MG PO TABS
3.0000 mg | ORAL_TABLET | Freq: Once | ORAL | Status: AC
  Administered 2017-06-05: 3 mg via ORAL
  Filled 2017-06-05: qty 1

## 2017-06-05 MED ORDER — DOXYCYCLINE HYCLATE 100 MG IV SOLR
100.0000 mg | Freq: Two times a day (BID) | INTRAVENOUS | Status: DC
  Administered 2017-06-05: 100 mg via INTRAVENOUS
  Filled 2017-06-05 (×2): qty 100

## 2017-06-05 NOTE — Consult Note (Signed)
Catherine HongMary L Harris is a 81 y.o. female  [id number]  Primary Cardiologist: Adrian BlackwaterShaukat Moncia Annas Reason for Consultation: Atrial fibrillation  HPI: This is a 81 year old white female with history of dementia chronic kidney disease and chronic atrial fibrillation hyperlipidemia hypertension presented to the hospital with shortness of breath as was found to have on chest x-ray left lower lobe pneumonia. Patient states she was very short of breath for a long time and presented to the hospital with some left-sided chest pain and shortness of breath.   Review of Systems: No orthopnea PND or leg swelling   Past Medical History:  Diagnosis Date  . A-fib (HCC)   . CKD (chronic kidney disease) stage 3, GFR 30-59 ml/min   . COPD (chronic obstructive pulmonary disease) (HCC)   . GERD (gastroesophageal reflux disease)   . Hyperlipidemia   . Hypertension   . IGT (impaired glucose tolerance)   . Renal disorder   . Sleep apnea   . Stroke (HCC)   . Thyroid disease     Medications Prior to Admission  Medication Sig Dispense Refill  . donepezil (ARICEPT) 5 MG tablet Take 5 mg by mouth at bedtime.    . DULoxetine (CYMBALTA) 30 MG capsule Take 30 mg by mouth daily.    . furosemide (LASIX) 20 MG tablet Take 20 mg by mouth daily.    Marland Kitchen. lisinopril (PRINIVIL,ZESTRIL) 5 MG tablet Take 5 mg by mouth daily.    . Melatonin 3 MG TABS Take 3 mg by mouth at bedtime.    . metoprolol tartrate (LOPRESSOR) 50 MG tablet Take 50 mg by mouth 2 (two) times daily.    . Multiple Vitamin (MULTI-VITAMINS) TABS Take 1 tablet by mouth daily.    . nitrofurantoin, macrocrystal-monohydrate, (MACROBID) 100 MG capsule Take 100 mg by mouth 2 (two) times daily.    Marland Kitchen. omeprazole (PRILOSEC) 20 MG capsule Take 20 mg by mouth daily.    . simvastatin (ZOCOR) 20 MG tablet Take 20 mg by mouth every evening.     . tolterodine (DETROL LA) 2 MG 24 hr capsule Take 1 capsule by mouth daily.    . traZODone (DESYREL) 50 MG tablet Take 50 mg by mouth  at bedtime.    Marland Kitchen. warfarin (COUMADIN) 3 MG tablet Take 3 mg by mouth daily.       Marland Kitchen. docusate sodium  100 mg Oral BID  . donepezil  5 mg Oral QHS  . DULoxetine  30 mg Oral Daily  . fesoterodine  4 mg Oral Daily  . furosemide  20 mg Oral Daily  . ipratropium-albuterol  3 mL Nebulization QID  . lisinopril  5 mg Oral Daily  . Melatonin  2.5 mg Oral QHS  . methylPREDNISolone (SOLU-MEDROL) injection  60 mg Intravenous Q6H  . metoprolol tartrate  50 mg Oral BID  . mometasone-formoterol  2 puff Inhalation BID  . montelukast  10 mg Oral QHS  . nitrofurantoin (macrocrystal-monohydrate)  100 mg Oral BID  . pantoprazole  40 mg Oral QAC breakfast  . simvastatin  20 mg Oral QPM  . tiotropium  18 mcg Inhalation Daily  . traZODone  50 mg Oral QHS    Infusions: . sodium chloride 75 mL/hr at 06/05/17 0530  . doxycycline (VIBRAMYCIN) IV Stopped (06/05/17 0740)  . famotidine (PEPCID) IV Stopped (06/04/17 2228)    No Known Allergies  Social History   Social History  . Marital status: Widowed    Spouse name: N/A  . Number of children:  N/A  . Years of education: N/A   Occupational History  . Not on file.   Social History Main Topics  . Smoking status: Former Games developer  . Smokeless tobacco: Never Used  . Alcohol use Not on file  . Drug use: Unknown  . Sexual activity: Not on file   Other Topics Concern  . Not on file   Social History Narrative  . No narrative on file    History reviewed. No pertinent family history.  PHYSICAL EXAM: Vitals:   06/05/17 0500 06/05/17 0700  BP: (!) 151/89 130/85  Pulse: 77 65  Resp: (!) 21 20  Temp:       Intake/Output Summary (Last 24 hours) at 06/05/17 0753 Last data filed at 06/05/17 0700  Gross per 24 hour  Intake           1617.5 ml  Output             1000 ml  Net            617.5 ml    General:  Well appearing. No respiratory difficulty HEENT: normal Neck: supple. no JVD. Carotids 2+ bilat; no bruits. No lymphadenopathy or  thryomegaly appreciated. Cor: PMI nondisplaced. Regular rate & rhythm. No rubs, gallops or murmurs. Lungs: clear Abdomen: soft, nontender, nondistended. No hepatosplenomegaly. No bruits or masses. Good bowel sounds. Extremities: no cyanosis, clubbing, rash, edema Neuro: alert & oriented x 3, cranial nerves grossly intact. moves all 4 extremities w/o difficulty. Affect pleasant.  WJX:[id number] fibrillation with controlled ventricular rate  Results for orders placed or performed during the hospital encounter of 06/04/17 (from the past 24 hour(s))  CBC     Status: Abnormal   Collection Time: 06/04/17 11:32 AM  Result Value Ref Range   WBC 7.3 3.6 - 11.0 K/uL   RBC 5.22 (H) 3.80 - 5.20 MIL/uL   Hemoglobin 14.7 12.0 - 16.0 g/dL   HCT 95.6 21.3 - 08.6 %   MCV 86.5 80.0 - 100.0 fL   MCH 28.2 26.0 - 34.0 pg   MCHC 32.6 32.0 - 36.0 g/dL   RDW 57.8 (H) 46.9 - 62.9 %   Platelets 134 (L) 150 - 440 K/uL  Troponin I     Status: Abnormal   Collection Time: 06/04/17 11:32 AM  Result Value Ref Range   Troponin I 0.04 (HH) <0.03 ng/mL  Comprehensive metabolic panel     Status: Abnormal   Collection Time: 06/04/17 11:32 AM  Result Value Ref Range   Sodium 137 135 - 145 mmol/L   Potassium 4.5 3.5 - 5.1 mmol/L   Chloride 101 101 - 111 mmol/L   CO2 28 22 - 32 mmol/L   Glucose, Bld 92 65 - 99 mg/dL   BUN 25 (H) 6 - 20 mg/dL   Creatinine, Ser 5.28 (H) 0.44 - 1.00 mg/dL   Calcium 9.0 8.9 - 41.3 mg/dL   Total Protein 6.3 (L) 6.5 - 8.1 g/dL   Albumin 3.6 3.5 - 5.0 g/dL   AST 25 15 - 41 U/L   ALT 20 14 - 54 U/L   Alkaline Phosphatase 99 38 - 126 U/L   Total Bilirubin 1.9 (H) 0.3 - 1.2 mg/dL   GFR calc non Af Amer 47 (L) >60 mL/min   GFR calc Af Amer 54 (L) >60 mL/min   Anion gap 8 5 - 15  Protime-INR     Status: Abnormal   Collection Time: 06/04/17 11:32 AM  Result Value Ref Range  Prothrombin Time 62.6 (H) 11.4 - 15.2 seconds   INR 7.00 (HH)   Troponin I (q 6hr x 3)     Status: Abnormal    Collection Time: 06/04/17  3:14 PM  Result Value Ref Range   Troponin I 0.04 (HH) <0.03 ng/mL  MRSA PCR Screening     Status: None   Collection Time: 06/04/17  3:45 PM  Result Value Ref Range   MRSA by PCR NEGATIVE NEGATIVE  Troponin I (q 6hr x 3)     Status: Abnormal   Collection Time: 06/04/17  8:29 PM  Result Value Ref Range   Troponin I 0.04 (HH) <0.03 ng/mL  Troponin I (q 6hr x 3)     Status: Abnormal   Collection Time: 06/05/17  2:13 AM  Result Value Ref Range   Troponin I 0.04 (HH) <0.03 ng/mL  Comprehensive metabolic panel     Status: Abnormal   Collection Time: 06/05/17  2:13 AM  Result Value Ref Range   Sodium 137 135 - 145 mmol/L   Potassium 4.3 3.5 - 5.1 mmol/L   Chloride 101 101 - 111 mmol/L   CO2 27 22 - 32 mmol/L   Glucose, Bld 161 (H) 65 - 99 mg/dL   BUN 25 (H) 6 - 20 mg/dL   Creatinine, Ser 1.61 (H) 0.44 - 1.00 mg/dL   Calcium 8.6 (L) 8.9 - 10.3 mg/dL   Total Protein 6.0 (L) 6.5 - 8.1 g/dL   Albumin 3.2 (L) 3.5 - 5.0 g/dL   AST 23 15 - 41 U/L   ALT 17 14 - 54 U/L   Alkaline Phosphatase 84 38 - 126 U/L   Total Bilirubin 1.5 (H) 0.3 - 1.2 mg/dL   GFR calc non Af Amer 51 (L) >60 mL/min   GFR calc Af Amer 59 (L) >60 mL/min   Anion gap 9 5 - 15  CBC     Status: Abnormal   Collection Time: 06/05/17  2:13 AM  Result Value Ref Range   WBC 3.5 (L) 3.6 - 11.0 K/uL   RBC 5.07 3.80 - 5.20 MIL/uL   Hemoglobin 14.7 12.0 - 16.0 g/dL   HCT 09.6 04.5 - 40.9 %   MCV 86.0 80.0 - 100.0 fL   MCH 28.9 26.0 - 34.0 pg   MCHC 33.6 32.0 - 36.0 g/dL   RDW 81.1 (H) 91.4 - 78.2 %   Platelets 118 (L) 150 - 440 K/uL  Protime-INR     Status: Abnormal   Collection Time: 06/05/17  2:13 AM  Result Value Ref Range   Prothrombin Time 80.5 (H) 11.4 - 15.2 seconds   INR 9.58 (HH)   Protime-INR     Status: Abnormal   Collection Time: 06/05/17  3:22 AM  Result Value Ref Range   Prothrombin Time 81.2 (H) 11.4 - 15.2 seconds   INR 9.69 (HH)   Glucose, capillary     Status: Abnormal    Collection Time: 06/05/17  7:31 AM  Result Value Ref Range   Glucose-Capillary 185 (H) 65 - 99 mg/dL   Dg Chest 2 View  Result Date: 06/04/2017 CLINICAL DATA:  COPD.  Shortness of breath. EXAM: CHEST  2 VIEW COMPARISON:  05/18/2017 FINDINGS: Lungs are hyperexpanded. Interstitial markings are diffusely coarsened with chronic features. The cardio pericardial silhouette is enlarged. New left base collapse/consolidation with small left pleural effusion. Bones are diffusely demineralized. IMPRESSION: Emphysema with left base collapse/ consolidation and small effusion. Imaging features may be related to pneumonia, but  close follow-up recommended to ensure resolution. Electronically Signed   By: Kennith Center M.D.   On: 06/04/2017 11:56   Dg Chest Port 1 View  Result Date: 06/05/2017 CLINICAL DATA:  81 year old female with radiographic for evaluation of healthcare associated pneumonia. EXAM: PORTABLE CHEST 1 VIEW COMPARISON:  Chest radiograph dated 06/04/2017 FINDINGS: There is cardiomegaly. There is background of emphysema. Bilateral lower lung field airspace densities likely combination of small bilateral pleural effusions and associated atelectatic changes of the lung bases. Infiltrate is not excluded. Clinical correlation is recommended. Overall there has been interval progression of bibasilar densities compared to the study of 06/04/2017. There is no pneumothorax. No acute osseous pathology. IMPRESSION: 1. Interval progression of bibasilar densities likely combination of small bilateral pleural effusions and associated atelectasis. Infiltrate not excluded. 2. Cardiomegaly. Electronically Signed   By: Elgie Collard M.D.   On: 06/05/2017 05:58     ASSESSMENT AND PLAN: Atrial fibrillation is chronic advise rate control with either Cardizem or metoprolol and will get echocardiogram to evaluate ejection fraction. Mildly elevated troponins due to demand ischemia.  Layken Doenges A

## 2017-06-05 NOTE — Progress Notes (Signed)
[facility] - [place] at [facility]   PATIENT NAME: Catherine Harris    MR#:  [id number]  DATE OF BIRTH:  June 29, 1936  SUBJECTIVE: Seen at bedside, had respiratory distress, found to have pneumonia, c/ of pleuritic chest pain on the left side. Heart rate is better.   CHIEF COMPLAINT:   Chief Complaint  Patient presents with  . Shortness of Breath  . Chest Pain    REVIEW OF SYSTEMS:    Review of Systems  Constitutional: Negative for chills and fever.  HENT: Negative for hearing loss.   Eyes: Negative for blurred vision, double vision and photophobia.  Respiratory: Positive for cough and shortness of breath. Negative for hemoptysis.   Cardiovascular: Negative for palpitations, orthopnea and leg swelling.  Gastrointestinal: Negative for abdominal pain, diarrhea and vomiting.  Genitourinary: Negative for dysuria and urgency.  Musculoskeletal: Negative for myalgias and neck pain.  Skin: Negative for rash.  Neurological: Negative for dizziness, focal weakness, seizures, weakness and headaches.  Psychiatric/Behavioral: Negative for memory loss. The patient does not have insomnia.     Nutrition:  Tolerating Diet: Tolerating PT:      DRUG ALLERGIES:  No Known Allergies  VITALS:  Blood pressure (!) 141/67, pulse 75, temperature 97.9 F (36.6 C), temperature source Axillary, resp. rate (!) 21, height 5\' 5"  (1.651 m), weight 71.5 kg (157 lb 10.1 oz), SpO2 92 %.  PHYSICAL EXAMINATION:   Physical Exam  GENERAL:  81 y.o.-year-old patient lying in the bed with no acute distress.  EYES: Pupils equal, round, reactive to light . No scleral icterus. Extraocular muscles intact.  HEENT: Head atraumatic, normocephalic. Oropharynx and nasopharynx clear.  NECK:  Supple, no jugular venous distention. No thyroid enlargement, no tenderness.  LUNGS: Normal breath sounds bilaterally, no wheezing, rales,rhonchi or crepitation. No use of accessory muscles of respiration.   CARDIOVASCULAR: S1, S2 normal. No murmurs, rubs, or gallops.  ABDOMEN: Soft, nontender, nondistended. Bowel sounds present. No organomegaly or mass.  EXTREMITIES: No pedal edema, cyanosis, or clubbing.  NEUROLOGIC: Cranial nerves II through XII are intact. Muscle strength 5/5 in all extremities. Sensation intact. Gait not checked.  PSYCHIATRIC: The patient is alert and oriented x 3.  SKIN: No obvious rash, lesion, or ulcer.    LABORATORY PANEL:   CBC  Recent Labs Lab 06/05/17 0213  WBC 3.5*  HGB 14.7  HCT 43.6  PLT 118*   ------------------------------------------------------------------------------------------------------------------  Chemistries   Recent Labs Lab 06/05/17 0213  NA 137  K 4.3  CL 101  CO2 27  GLUCOSE 161*  BUN 25*  CREATININE 1.03*  CALCIUM 8.6*  AST 23  ALT 17  ALKPHOS 84  BILITOT 1.5*   ------------------------------------------------------------------------------------------------------------------  Cardiac Enzymes  Recent Labs Lab 06/05/17 0213  TROPONINI 0.04*   ------------------------------------------------------------------------------------------------------------------  RADIOLOGY:  Dg Chest 2 View  Result Date: 06/04/2017 CLINICAL DATA:  COPD.  Shortness of breath. EXAM: CHEST  2 VIEW COMPARISON:  05/18/2017 FINDINGS: Lungs are hyperexpanded. Interstitial markings are diffusely coarsened with chronic features. The cardio pericardial silhouette is enlarged. New left base collapse/consolidation with small left pleural effusion. Bones are diffusely demineralized. IMPRESSION: Emphysema with left base collapse/ consolidation and small effusion. Imaging features may be related to pneumonia, but close follow-up recommended to ensure resolution. Electronically Signed   By: Kennith Center M.D.   On: 06/04/2017 11:56   Dg Chest Port 1 View  Result Date: 06/05/2017 CLINICAL DATA:  81 year old female with radiographic for evaluation of  healthcare associated pneumonia. EXAM: PORTABLE  CHEST 1 VIEW COMPARISON:  Chest radiograph dated 06/04/2017 FINDINGS: There is cardiomegaly. There is background of emphysema. Bilateral lower lung field airspace densities likely combination of small bilateral pleural effusions and associated atelectatic changes of the lung bases. Infiltrate is not excluded. Clinical correlation is recommended. Overall there has been interval progression of bibasilar densities compared to the study of 06/04/2017. There is no pneumothorax. No acute osseous pathology. IMPRESSION: 1. Interval progression of bibasilar densities likely combination of small bilateral pleural effusions and associated atelectasis. Infiltrate not excluded. 2. Cardiomegaly. Electronically Signed   By: Elgie CollardArash  Radparvar M.D.   On: 06/05/2017 05:58     ASSESSMENT AND PLAN:   Principal Problem:   Acute on chronic respiratory failure (HCC) Active Problems:   HCAP (healthcare-associated pneumonia)   Atrial fibrillation, chronic (HCC)   COPD exacerbation (HCC)   #1 acute on chronic respiratory failure secondary to pneumonia: Continue IV antibiotics, oxygen follow blood cultures. #2. atrial fibrillation : Rate controlled. Highly elevated INR of 9. And now today is 2. Pharmacy to dose anticoagulation. #3. Dementia: Continue Aricept. #4 COPD exacerbation: Does have wheezing: Continue bronchodilators, IV steroids. Seen by pulmonary, cardiology. Follow echocardiogram for A. fib. For GI and DVT prophylaxis.  #5 slightly elevated troponins likely demand ischemia from pneumonia. Cardiology recommended echocardiogram.  All the records are reviewed and case discussed with Care Management/Social Workerr. Management plans discussed with the patient, family and they are in agreement.  CODE STATUS:full  TOTAL TIME TAKING CARE OF THIS PATIENT: 35 minutes.   POSSIBLE D/C IN 1-2 DAYS, DEPENDING ON CLINICAL CONDITION.   Katha HammingKONIDENA,Daniele Dillow M.D on  06/05/2017 at 12:02 PM  Between 7am to 6pm - Pager - [phone]  After 6pm go to [email or web address] - password [email or web address]  [name] [place] [facility]  Office  [phone]  CC: Primary care physician; System, Pcp Not In

## 2017-06-05 NOTE — Progress Notes (Signed)
RN spoke with Dr. Belia HemanKasa and made MD aware that patient's o2 sats are 94% on 3 L nasal cannula and asked about reassessing patient for transfer to the floor.  MD gave order for patient to be transferred to any floor with tele and that o2 sat goal is 88-92%.

## 2017-06-05 NOTE — Progress Notes (Signed)
CRITICAL VALUE ALERT  Critical Value:  PT 80.5 INR 9.58 Date & Time Notied:  06/05/2017 0300 Provider Notified: M. Luci Bankukov NP Orders Received/Actions taken: Orders received to repeat lab stat

## 2017-06-05 NOTE — Progress Notes (Signed)
ANTICOAGULATION CONSULT NOTE  Pharmacy Consult for warfarin dosing  Indication: atrial fibrillation    Pharmacy consulted for warfarin management for 81 yo female on warfarin as an outpatient for atrial fibrillation, goal INR 2-3. Patient admitted with elevated INR and received phytonadione 10mg  IV Q Q24hr.   Plan:  Will order warfarin 3mg  x 1 and obtain INR with am labs. Expect vitamin K to continue to decrease INR.   No Known Allergies  Patient Measurements: Height: 5\' 5"  (165.1 cm) Weight: 157 lb 10.1 oz (71.5 kg) IBW/kg (Calculated) : 57   Vital Signs: Temp: 97.8 F (36.6 C) (07/09 1800) Temp Source: Axillary (07/09 1800) BP: 110/62 (07/09 1400) Pulse Rate: 83 (07/09 1400)  Labs:  Recent Labs  06/04/17 1132 06/04/17 1514 06/04/17 2029 06/05/17 0213 06/05/17 0322 06/05/17 1113  HGB 14.7  --   --  14.7  --   --   HCT 45.1  --   --  43.6  --   --   PLT 134*  --   --  118*  --   --   LABPROT 62.6*  --   --  80.5* 81.2* 23.1*  INR 7.00*  --   --  9.58* 9.69* 2.01  CREATININE 1.10*  --   --  1.03*  --   --   TROPONINI 0.04* 0.04* 0.04* 0.04*  --   --     Estimated Creatinine Clearance: 44.6 mL/min (A) (by C-G formula based on SCr of 1.03 mg/dL (H)).  Pharmacy will continue to monitor and adjust per consult.   Asif Muchow L 06/05/2017,7:45 PM

## 2017-06-06 LAB — CBC
HEMATOCRIT: 44.5 % (ref 35.0–47.0)
HEMOGLOBIN: 14.7 g/dL (ref 12.0–16.0)
MCH: 28.6 pg (ref 26.0–34.0)
MCHC: 33 g/dL (ref 32.0–36.0)
MCV: 86.7 fL (ref 80.0–100.0)
Platelets: 125 10*3/uL — ABNORMAL LOW (ref 150–440)
RBC: 5.13 MIL/uL (ref 3.80–5.20)
RDW: 15 % — ABNORMAL HIGH (ref 11.5–14.5)
WBC: 15.3 10*3/uL — AB (ref 3.6–11.0)

## 2017-06-06 LAB — PROTIME-INR
INR: 1.36
PROTHROMBIN TIME: 16.9 s — AB (ref 11.4–15.2)

## 2017-06-06 LAB — ECHOCARDIOGRAM COMPLETE
HEIGHTINCHES: 65 in
Weight: 2522.06 oz

## 2017-06-06 MED ORDER — APIXABAN 5 MG PO TABS
5.0000 mg | ORAL_TABLET | Freq: Two times a day (BID) | ORAL | Status: DC
  Administered 2017-06-06 – 2017-06-13 (×15): 5 mg via ORAL
  Filled 2017-06-06 (×15): qty 1

## 2017-06-06 MED ORDER — WARFARIN SODIUM 5 MG PO TABS: 5.0000 mg | ORAL_TABLET | Freq: Once | ORAL | Status: DC

## 2017-06-06 NOTE — Progress Notes (Signed)
Report called to Eugene GarnetKarimah, RN on 1A. Patient transported on O2 and portable telemetry to room 151 on hospital bed by Madaline BrilliantJackie T., NT.

## 2017-06-06 NOTE — Plan of Care (Signed)
Problem: Education: Goal: Knowledge of [facility] Education information/materials will improve Outcome: Not Progressing Variance: Physical/mental limitations

## 2017-06-06 NOTE — NC FL2 (Signed)
[place] MEDICAID FL2 LEVEL OF CARE SCREENING TOOL     IDENTIFICATION  Patient Name: Catherine Harris Birthdate: [birth year] Sex: female Admission Date (Current Location): 06/04/2017  [place] and [place] Number:  Chiropodist and Address:  [facility], [address] [place], [place] [address]      Provider Number: [id number]  Attending Physician Name and Address:  Katha Hamming, MD  Relative Name and Phone Number:       Current Level of Care: Hospital Recommended Level of Care: Skilled Nursing Facility Prior Approval Number:    Date Approved/Denied:   PASRR Number: [id number]  Discharge Plan: SNF    Current Diagnoses: Patient Active Problem List   Diagnosis Date Noted  . Acute on chronic respiratory failure (HCC) 06/04/2017  . HCAP (healthcare-associated pneumonia) 06/04/2017  . Atrial fibrillation, chronic (HCC) 06/04/2017  . COPD exacerbation (HCC) 06/04/2017  . Dementia 05/18/2017    Orientation RESPIRATION BLADDER Height & Weight     Self, Place  O2, Normal (2 liters) Incontinent Weight: 173 lb 11.6 oz (78.8 kg) Height:  5\' 5"  (165.1 cm)  BEHAVIORAL SYMPTOMS/MOOD NEUROLOGICAL BOWEL NUTRITION STATUS   (none)  (none) Incontinent Diet (regular)  AMBULATORY STATUS COMMUNICATION OF NEEDS Skin   Extensive Assist Verbally Normal                       Personal Care Assistance Level of Assistance  Bathing, Dressing Bathing Assistance: Maximum assistance   Dressing Assistance: Maximum assistance     Functional Limitations Info    Sight Info: Adequate Hearing Info: Adequate Speech Info: Adequate    SPECIAL CARE FACTORS FREQUENCY  PT (By licensed PT)                    Contractures Contractures Info: Not present    Additional Factors Info  Code Status, Allergies Code Status Info: full Allergies Info: nka           Current Medications (06/06/2017):  This is the current hospital active  medication list Current Facility-Administered Medications  Medication Dose Route Frequency Provider Last Rate Last Dose  . 0.9 %  sodium chloride infusion   Intravenous Continuous Marguarite Arbour, MD 75 mL/hr at 06/06/17 1300    . acetaminophen (TYLENOL) tablet 650 mg  650 mg Oral Q6H PRN Marguarite Arbour, MD       Or  . acetaminophen (TYLENOL) suppository 650 mg  650 mg Rectal Q6H PRN Marguarite Arbour, MD      . apixaban Everlene Balls) tablet 5 mg  5 mg Oral BID Erin Fulling, MD   5 mg at 06/06/17 1040  . bisacodyl (DULCOLAX) suppository 10 mg  10 mg Rectal Daily PRN Marguarite Arbour, MD      . docusate sodium (COLACE) capsule 100 mg  100 mg Oral BID Marguarite Arbour, MD   100 mg at 06/06/17 1022  . donepezil (ARICEPT) tablet 5 mg  5 mg Oral QHS Marguarite Arbour, MD   5 mg at 06/05/17 2137  . DULoxetine (CYMBALTA) DR capsule 30 mg  30 mg Oral Daily Marguarite Arbour, MD   30 mg at 06/06/17 1022  . famotidine (PEPCID) tablet 20 mg  20 mg Oral Daily Erin Fulling, MD   20 mg at 06/06/17 1022  . fesoterodine (TOVIAZ) tablet 4 mg  4 mg Oral Daily Marguarite Arbour, MD   4 mg at 06/06/17 1021  . furosemide (LASIX)  tablet 20 mg  20 mg Oral Daily Marguarite ArbourSparks, Jeffrey D, MD   20 mg at 06/06/17 1023  . ipratropium-albuterol (DUONEB) 0.5-2.5 (3) MG/3ML nebulizer solution 3 mL  3 mL Nebulization QID Marguarite ArbourSparks, Jeffrey D, MD   3 mL at 06/06/17 1114  . lisinopril (PRINIVIL,ZESTRIL) tablet 5 mg  5 mg Oral Daily Marguarite ArbourSparks, Jeffrey D, MD   5 mg at 06/06/17 1021  . LORazepam (ATIVAN) injection 0.5 mg  0.5 mg Intravenous Q4H PRN Marguarite ArbourSparks, Jeffrey D, MD      . Melatonin TABS 2.5 mg  2.5 mg Oral QHS Marguarite ArbourSparks, Jeffrey D, MD   2.5 mg at 06/05/17 2140  . methylPREDNISolone sodium succinate (SOLU-MEDROL) 40 mg/mL injection 20 mg  20 mg Intravenous Daily Erin FullingKasa, Kurian, MD   20 mg at 06/06/17 1019  . metoprolol tartrate (LOPRESSOR) tablet 50 mg  50 mg Oral BID Marguarite ArbourSparks, Jeffrey D, MD   Stopped at 06/06/17 1000  . mometasone-formoterol  (DULERA) 200-5 MCG/ACT inhaler 2 puff  2 puff Inhalation BID Marguarite ArbourSparks, Jeffrey D, MD   2 puff at 06/06/17 1023  . montelukast (SINGULAIR) tablet 10 mg  10 mg Oral QHS Marguarite ArbourSparks, Jeffrey D, MD   10 mg at 06/05/17 2136  . morphine 2 MG/ML injection 2 mg  2 mg Intravenous Q2H PRN Marguarite ArbourSparks, Jeffrey D, MD      . ondansetron [facility](ZOFRAN) tablet 4 mg  4 mg Oral Q6H PRN Marguarite ArbourSparks, Jeffrey D, MD       Or  . ondansetron (ZOFRAN) injection 4 mg  4 mg Intravenous Q6H PRN Marguarite ArbourSparks, Jeffrey D, MD      . pantoprazole (PROTONIX) EC tablet 40 mg  40 mg Oral QAC breakfast Marguarite ArbourSparks, Jeffrey D, MD   40 mg at 06/06/17 1022  . simvastatin (ZOCOR) tablet 20 mg  20 mg Oral QPM Marguarite ArbourSparks, Jeffrey D, MD   20 mg at 06/05/17 1818  . tiotropium (SPIRIVA) inhalation capsule 18 mcg  18 mcg Inhalation Daily Marguarite ArbourSparks, Jeffrey D, MD   18 mcg at 06/06/17 1024  . traZODone (DESYREL) tablet 50 mg  50 mg Oral QHS Marguarite ArbourSparks, Jeffrey D, MD   50 mg at 06/05/17 2136     Discharge Medications: Please see discharge summary for a list of discharge medications.  Relevant Imaging Results:  Relevant Lab Results:   Additional Information ss: [id number]  York SpanielMonica Alberta Lenhard, LCSW

## 2017-06-06 NOTE — Progress Notes (Signed)
ANTICOAGULATION CONSULT NOTE  Pharmacy Consult for warfarin dosing  Indication: atrial fibrillation    Pharmacy consulted for warfarin management for 81 yo female on warfarin as an outpatient for atrial fibrillation, goal INR 2-3. Patient admitted with elevated INR and received phytonadione 10mg  IV Q Q24hr.   Plan:  Patient admitted from [facility] with elevated INR. Per discussion on rounds patient transitioned to apixaban 5mg  BID.    Will recheck serum creatinine with am labs and complete warfarin consult.   No Known Allergies  Patient Measurements: Height: 5\' 5"  (165.1 cm) Weight: 173 lb 11.6 oz (78.8 kg) IBW/kg (Calculated) : 57   Vital Signs: Temp: 97.5 F (36.4 C) (07/10 0800) Temp Source: Oral (07/10 0800) BP: 104/68 (07/10 1018) Pulse Rate: 62 (07/10 1100)  Labs:  Recent Labs  06/04/17 1132 06/04/17 1514 06/04/17 2029 06/05/17 0213 06/05/17 0322 06/05/17 1113 06/06/17 0332  HGB 14.7  --   --  14.7  --   --  14.7  HCT 45.1  --   --  43.6  --   --  44.5  PLT 134*  --   --  118*  --   --  125*  LABPROT 62.6*  --   --  80.5* 81.2* 23.1* 16.9*  INR 7.00*  --   --  9.58* 9.69* 2.01 1.36  CREATININE 1.10*  --   --  1.03*  --   --   --   TROPONINI 0.04* 0.04* 0.04* 0.04*  --   --   --     Estimated Creatinine Clearance: 46.7 mL/min (A) (by C-G formula based on SCr of 1.03 mg/dL (H)).  Pharmacy will continue to monitor and adjust per consult.   Wendee Hata L 06/06/2017,12:00 PM

## 2017-06-06 NOTE — Clinical Social Work Note (Signed)
CSW has left message for patient's stepdaughter: Catherine CaoHelen Houdek: [phone]. FL2 has been completed and sent for signature.  York SpanielMonica Tisha Cline MSW,LCSW [phone]

## 2017-06-06 NOTE — Progress Notes (Signed)
[facility] - [place] at [facility]   PATIENT NAME: Catherine Harris    MR#:  [id number]  DATE OF BIRTH:  12-Apr-1936  SUBJECTIVE; says that she feels nauseous and does not want to eat.   CHIEF COMPLAINT:   Chief Complaint  Patient presents with  . Shortness of Breath  . Chest Pain    REVIEW OF SYSTEMS:    Review of Systems  Constitutional: Negative for chills and fever.  HENT: Negative for hearing loss.   Eyes: Negative for blurred vision, double vision and photophobia.  Respiratory: Positive for cough. Negative for hemoptysis and shortness of breath.   Cardiovascular: Negative for palpitations, orthopnea and leg swelling.  Gastrointestinal: Positive for nausea. Negative for abdominal pain, diarrhea and vomiting.  Genitourinary: Negative for dysuria and urgency.  Musculoskeletal: Negative for myalgias and neck pain.  Skin: Negative for rash.  Neurological: Negative for dizziness, focal weakness, seizures, weakness and headaches.  Psychiatric/Behavioral: Negative for memory loss. The patient does not have insomnia.     Nutrition:  Tolerating Diet: Tolerating PT:      DRUG ALLERGIES:  No Known Allergies  VITALS:  Blood pressure 104/68, pulse 62, temperature (!) 97.5 F (36.4 C), temperature source Oral, resp. rate (!) 25, height 5\' 5"  (1.651 m), weight 78.8 kg (173 lb 11.6 oz), SpO2 93 %.  PHYSICAL EXAMINATION:   Physical Exam  GENERAL:  81 y.o.-year-old patient lying in the bed with no acute distress.  EYES: Pupils equal, round, reactive to light . No scleral icterus. Extraocular muscles intact.  HEENT: Head atraumatic, normocephalic. Oropharynx and nasopharynx clear.  NECK:  Supple, no jugular venous distention. No thyroid enlargement, no tenderness.  LUNGS: Normal breath sounds bilaterally, no wheezing, rales,rhonchi or crepitation. No use of accessory muscles of respiration.  CARDIOVASCULAR: S1, S2 normal. No murmurs, rubs, or gallops.   ABDOMEN: Soft, nontender, nondistended. Bowel sounds present. No organomegaly or mass.  EXTREMITIES: No pedal edema, cyanosis, or clubbing.  NEUROLOGIC: Cranial nerves II through XII are intact. Muscle strength 5/5 in all extremities. Sensation intact. Gait not checked.  PSYCHIATRIC: The patient is alert and oriented x 3.  SKIN: No obvious rash, lesion, or ulcer.    LABORATORY PANEL:   CBC  Recent Labs Lab 06/06/17 0332  WBC 15.3*  HGB 14.7  HCT 44.5  PLT 125*   ------------------------------------------------------------------------------------------------------------------  Chemistries   Recent Labs Lab 06/05/17 0213  NA 137  K 4.3  CL 101  CO2 27  GLUCOSE 161*  BUN 25*  CREATININE 1.03*  CALCIUM 8.6*  AST 23  ALT 17  ALKPHOS 84  BILITOT 1.5*   ------------------------------------------------------------------------------------------------------------------  Cardiac Enzymes  Recent Labs Lab 06/05/17 0213  TROPONINI 0.04*   ------------------------------------------------------------------------------------------------------------------  RADIOLOGY:  Dg Chest Port 1 View  Result Date: 06/05/2017 CLINICAL DATA:  81 year old female with radiographic for evaluation of healthcare associated pneumonia. EXAM: PORTABLE CHEST 1 VIEW COMPARISON:  Chest radiograph dated 06/04/2017 FINDINGS: There is cardiomegaly. There is background of emphysema. Bilateral lower lung field airspace densities likely combination of small bilateral pleural effusions and associated atelectatic changes of the lung bases. Infiltrate is not excluded. Clinical correlation is recommended. Overall there has been interval progression of bibasilar densities compared to the study of 06/04/2017. There is no pneumothorax. No acute osseous pathology. IMPRESSION: 1. Interval progression of bibasilar densities likely combination of small bilateral pleural effusions and associated atelectasis. Infiltrate not  excluded. 2. Cardiomegaly. Electronically Signed   By: Elgie Collard M.D.   On:  06/05/2017 05:58     ASSESSMENT AND PLAN:   Principal Problem:   Acute on chronic respiratory failure (HCC) Active Problems:   HCAP (healthcare-associated pneumonia)   Atrial fibrillation, chronic (HCC)   COPD exacerbation (HCC)   #1 acute on chronic respiratory failure secondary to pneumonia: Continue IV antibiotics, oxygen follow blood cultures. #2. atrial fibrillation : supra Therapeutic INR on admission, today INR 1.36.Marland Kitchen. Started on Eliquis/   #3. Dementia: Continue Aricept . #4 COPD exacerbation: Does have wheezing: Continue bronchodilators, IV steroids.  Seen by pulmonary, cardiology. Echo shows EF 75%.  #5 slightly elevated troponins likely demand ischemia from pneumonia. Cardiology recommended echocardiogram. Transfer to telemetry  All the records are reviewed and case discussed with Care Management/Social Workerr. Management plans discussed with the patient, family and they are in agreement.  CODE STATUS:full  TOTAL TIME TAKING CARE OF THIS PATIENT: 35 minutes.   POSSIBLE D/C IN 1-2 DAYS, DEPENDING ON CLINICAL CONDITION.   Katha HammingKONIDENA,Ormand Senn M.D on 06/06/2017 at 1:32 PM  Between 7am to 6pm - Pager - [phone]  After 6pm go to [email or web address] - password [email or web address]  [name] [place] [facility]  Office  [phone]  CC: Primary care physician; System, Pcp Not In

## 2017-06-06 NOTE — Progress Notes (Signed)
SUBJECTIVE:Patient is still short of breath   Vitals:   06/06/17 0800 06/06/17 0900 06/06/17 1000 06/06/17 1018  BP: 134/66   104/68  Pulse: 64 77 (!) 58   Resp: 20 12 (!) 22   Temp: (!) 97.5 F (36.4 C)     TempSrc: Oral     SpO2: 90% (!) 88% 91%   Weight:      Height:        Intake/Output Summary (Last 24 hours) at 06/06/17 1049 Last data filed at 06/06/17 1000  Gross per 24 hour  Intake             2440 ml  Output              250 ml  Net             2190 ml    LABS: Basic Metabolic Panel:  Recent Labs  16/08/9606/08/18 1132 06/05/17 0213  NA 137 137  K 4.5 4.3  CL 101 101  CO2 28 27  GLUCOSE 92 161*  BUN 25* 25*  CREATININE 1.10* 1.03*  CALCIUM 9.0 8.6*   Liver Function Tests:  Recent Labs  06/04/17 1132 06/05/17 0213  AST 25 23  ALT 20 17  ALKPHOS 99 84  BILITOT 1.9* 1.5*  PROT 6.3* 6.0*  ALBUMIN 3.6 3.2*   No results for input(s): LIPASE, AMYLASE in the last 72 hours. CBC:  Recent Labs  06/05/17 0213 06/06/17 0332  WBC 3.5* 15.3*  HGB 14.7 14.7  HCT 43.6 44.5  MCV 86.0 86.7  PLT 118* 125*   Cardiac Enzymes:  Recent Labs  06/04/17 1514 06/04/17 2029 06/05/17 0213  TROPONINI 0.04* 0.04* 0.04*   BNP: Invalid input(s): POCBNP D-Dimer: No results for input(s): DDIMER in the last 72 hours. Hemoglobin A1C: No results for input(s): HGBA1C in the last 72 hours. Fasting Lipid Panel: No results for input(s): CHOL, HDL, LDLCALC, TRIG, CHOLHDL, LDLDIRECT in the last 72 hours. Thyroid Function Tests: No results for input(s): TSH, T4TOTAL, T3FREE, THYROIDAB in the last 72 hours.  Invalid input(s): FREET3 Anemia Panel: No results for input(s): VITAMINB12, FOLATE, FERRITIN, TIBC, IRON, RETICCTPCT in the last 72 hours.   PHYSICAL EXAM General: Well developed, well nourished, in no acute distress HEENT:  Normocephalic and atramatic Neck:  No JVD.  Lungs: Clear bilaterally to auscultation and percussion. Heart: HRRR . Normal S1 and S2 without  gallops or murmurs.  Abdomen: Bowel sounds are positive, abdomen soft and non-tender  Msk:  Back normal, normal gait. Normal strength and tone for age. Extremities: No clubbing, cyanosis or edema.   Neuro: Alert and oriented X 3. Psych:  Good affect, responds appropriately  TELEMETRY: Afib 70/min  ASSESSMENT AND PLAN: Chronic afib, with controlled rate, agree Anticoagulation, and coumadin started. Principal Problem:   Acute on chronic respiratory failure (HCC) Active Problems:   HCAP (healthcare-associated pneumonia)   Atrial fibrillation, chronic (HCC)   COPD exacerbation (HCC)    Rejeana Fadness A, MD, [place] [facility] 06/06/2017 10:49 AM

## 2017-06-07 LAB — BASIC METABOLIC PANEL
ANION GAP: 6 (ref 5–15)
BUN: 37 mg/dL — AB (ref 6–20)
CALCIUM: 8.3 mg/dL — AB (ref 8.9–10.3)
CO2: 26 mmol/L (ref 22–32)
CREATININE: 1.02 mg/dL — AB (ref 0.44–1.00)
Chloride: 107 mmol/L (ref 101–111)
GFR calc Af Amer: 59 mL/min — ABNORMAL LOW (ref >60)
GFR, EST NON AFRICAN AMERICAN: 51 mL/min — AB (ref >60)
GLUCOSE: 98 mg/dL (ref 65–99)
Potassium: 4.2 mmol/L (ref 3.5–5.1)
Sodium: 139 mmol/L (ref 135–145)

## 2017-06-07 MED ORDER — PREDNISONE 10 MG PO TABS
10.0000 mg | ORAL_TABLET | Freq: Every day | ORAL | Status: DC
  Administered 2017-06-08: 10 mg via ORAL
  Filled 2017-06-07: qty 1

## 2017-06-07 MED ORDER — IPRATROPIUM-ALBUTEROL 0.5-2.5 (3) MG/3ML IN SOLN
3.0000 mL | Freq: Three times a day (TID) | RESPIRATORY_TRACT | Status: DC
  Administered 2017-06-07 – 2017-06-09 (×6): 3 mL via RESPIRATORY_TRACT
  Filled 2017-06-07 (×6): qty 3

## 2017-06-07 NOTE — Progress Notes (Signed)
SUBJECTIVE: Pt is feeling some abdominal pain, short of breath. No chest pain.    Vitals:   06/06/17 2021 06/06/17 2140 06/07/17 0044 06/07/17 0500  BP:  (!) 142/82 124/73   Pulse:  88 71   Resp:   20   Temp:   97.6 F (36.4 C)   TempSrc:   Oral   SpO2: 93%  99%   Weight:    173 lb (78.5 kg)  Height:        Intake/Output Summary (Last 24 hours) at 06/07/17 0943 Last data filed at 06/07/17 0320  Gross per 24 hour  Intake             1375 ml  Output                0 ml  Net             1375 ml    LABS: Basic Metabolic Panel:  Recent Labs  63/11/6005/09/18 0213 06/07/17 0451  NA 137 139  K 4.3 4.2  CL 101 107  CO2 27 26  GLUCOSE 161* 98  BUN 25* 37*  CREATININE 1.03* 1.02*  CALCIUM 8.6* 8.3*   Liver Function Tests:  Recent Labs  06/04/17 1132 06/05/17 0213  AST 25 23  ALT 20 17  ALKPHOS 99 84  BILITOT 1.9* 1.5*  PROT 6.3* 6.0*  ALBUMIN 3.6 3.2*   No results for input(s): LIPASE, AMYLASE in the last 72 hours. CBC:  Recent Labs  06/05/17 0213 06/06/17 0332  WBC 3.5* 15.3*  HGB 14.7 14.7  HCT 43.6 44.5  MCV 86.0 86.7  PLT 118* 125*   Cardiac Enzymes:  Recent Labs  06/04/17 1514 06/04/17 2029 06/05/17 0213  TROPONINI 0.04* 0.04* 0.04*   BNP: Invalid input(s): POCBNP D-Dimer: No results for input(s): DDIMER in the last 72 hours. Hemoglobin A1C: No results for input(s): HGBA1C in the last 72 hours. Fasting Lipid Panel: No results for input(s): CHOL, HDL, LDLCALC, TRIG, CHOLHDL, LDLDIRECT in the last 72 hours. Thyroid Function Tests: No results for input(s): TSH, T4TOTAL, T3FREE, THYROIDAB in the last 72 hours.  Invalid input(s): FREET3 Anemia Panel: No results for input(s): VITAMINB12, FOLATE, FERRITIN, TIBC, IRON, RETICCTPCT in the last 72 hours.   PHYSICAL General: Pale, lethargic, pained expression HEENT:  Normocephalic and atramatic Neck:  No JVD.  Lungs: Clear bilaterally to auscultation and percussion. Heart: HRRR . Normal S1 and S2  without gallops or murmurs.  Abdomen: Bowel sounds are positive, abdomen soft tender to palpation  Msk:  Back normal, normal gait. Normal strength and tone for age. Extremities: No clubbing, cyanosis or edema.   Neuro: Alert and oriented X 3. Psych:  Sleepy, slow to respond  TELEMETRY: Afib rate controlled 68bpm  ASSESSMENT AND PLAN: Echo 06/05/17: The right ventricular systolic pressure was increased consistent   with severe pulmonary hypertension. Severely dilated atrias, with   normal LVEF, and moderate MR and severe TR.  Continue Eliquis for chronic afib. Rate controlled currently. Continue oral Lasix. Blood pressure is stable. Will continue to monitor.   Recommend  Principal Problem:   Acute on chronic respiratory failure (HCC) Active Problems:   HCAP (healthcare-associated pneumonia)   Atrial fibrillation, chronic (HCC)   COPD exacerbation (HCC)    Catherine HammanKristin Eaden Hettinger, NP-C 06/07/2017 9:43 AM

## 2017-06-07 NOTE — Progress Notes (Signed)
[facility] [place] [facility]   PATIENT NAME: Catherine Harris    MR#:  [id number]  DATE OF BIRTH:  1936/09/13  SUBJECTIVE; the patient complains of abdominal pain, no nausea or vomiting. 4 by mouth intake. She  is confused at baseline.   CHIEF COMPLAINT:   Chief Complaint  Patient presents with  . Shortness of Breath  . Chest Pain    REVIEW OF SYSTEMS:    Review of Systems  Constitutional: Negative for chills and fever.  HENT: Negative for hearing loss.   Eyes: Negative for blurred vision, double vision and photophobia.  Respiratory: Negative for cough, hemoptysis and shortness of breath.   Cardiovascular: Negative for palpitations, orthopnea and leg swelling.  Gastrointestinal: Positive for nausea. Negative for abdominal pain, diarrhea and vomiting.  Genitourinary: Negative for dysuria and urgency.  Musculoskeletal: Negative for myalgias and neck pain.  Skin: Negative for rash.  Neurological: Negative for dizziness, focal weakness, seizures, weakness and headaches.  Psychiatric/Behavioral: Negative for memory loss. The patient does not have insomnia.     Nutrition:  Tolerating Diet: Tolerating PT:      DRUG ALLERGIES:  No Known Allergies  VITALS:  Blood pressure 124/73, pulse 71, temperature 97.6 F (36.4 C), temperature source Oral, resp. rate 20, height 5\' 5"  (1.651 m), weight 78.5 kg (173 lb), SpO2 99 %.  PHYSICAL EXAMINATION:   Physical Exam  GENERAL:  81 y.o.-year-old patient lying in the bed with no acute distress.  EYES: Pupils equal, round, reactive to light . No scleral icterus. Extraocular muscles intact.  HEENT: Head atraumatic, normocephalic. Oropharynx and nasopharynx clear.  NECK:  Supple, no jugular venous distention. No thyroid enlargement, no tenderness.  LUNGS: Normal breath sounds bilaterally, no wheezing, rales,rhonchi or crepitation. No use of accessory muscles of respiration.  CARDIOVASCULAR: S1, S2 normal.  No murmurs, rubs, or gallops.  ABDOMEN: Soft, nontender, nondistended. Bowel sounds present. No organomegaly or mass.  EXTREMITIES: No pedal edema, cyanosis, or clubbing.  NEUROLOGIC: unable  To do full neurologic exam because of confusion. PSYCHIATRIC: confused at baseline SKIN: No obvious rash, lesion, or ulcer.    LABORATORY PANEL:   CBC  Recent Labs Lab 06/06/17 0332  WBC 15.3*  HGB 14.7  HCT 44.5  PLT 125*   ------------------------------------------------------------------------------------------------------------------  Chemistries   Recent Labs Lab 06/05/17 0213 06/07/17 0451  NA 137 139  K 4.3 4.2  CL 101 107  CO2 27 26  GLUCOSE 161* 98  BUN 25* 37*  CREATININE 1.03* 1.02*  CALCIUM 8.6* 8.3*  AST 23  --   ALT 17  --   ALKPHOS 84  --   BILITOT 1.5*  --    ------------------------------------------------------------------------------------------------------------------  Cardiac Enzymes  Recent Labs Lab 06/05/17 0213  TROPONINI 0.04*   ------------------------------------------------------------------------------------------------------------------  RADIOLOGY:  No results found.   ASSESSMENT AND PLAN:   Principal Problem:   Acute on chronic respiratory failure (HCC) Active Problems:   HCAP (healthcare-associated pneumonia)   Atrial fibrillation, chronic (HCC)   COPD exacerbation (HCC)   #1 acute on chronic respiratory failure secondary to pneumonia: Clinically improving, pro-calcitonin level less than 0.10, WBC 15.3 yesterday, check today also. Wen off  oxygen, now on 4 L of oxygen, saturation 99%.  #2. atrial fibrillation : supra Therapeutic INR on admission, INR 1.36 yesterday.. Started on Eliquis/   #3. Dementia: Continue Aricept . #4 COPD exacerbation: clinically less wheezing today.: Continue bronchodilators, IV steroids.  Seen by pulmonary, cardiology. Echo shows EF 75%.  #5.  slightly elevated troponins likely demand ischemia  from pneumonia. Cardiology recommended echocardiogram.. Echo  cardiogram shows she is 70% with normal wall motion. 6.disposition to skilled nursing T.     All the records are reviewed and case discussed with Care Management/Social Workerr. Management plans discussed with the patient, family and they are in agreement.  CODE STATUS:full  TOTAL TIME TAKING CARE OF THIS PATIENT: 35 minutes.   POSSIBLE D/C IN 1-2 DAYS, DEPENDING ON CLINICAL CONDITION.   Katha HammingKONIDENA,Suleika Donavan M.D on 06/07/2017 at 1:02 PM  Between 7am to 6pm - Pager - [phone]  After 6pm go to [email or web address] - password [email or web address]  [name]  Office  [phone]  CC: Primary care physician; System, Pcp Not In

## 2017-06-07 NOTE — Clinical Social Work Note (Signed)
Clinical Social Work Assessment  Patient Details  Name: Catherine Harris MRN: [id number] Date of Birth: [birth year]  Date of referral:  06/07/17               Reason for consult:  Facility Placement                Permission sought to share information with:    Permission granted to share information::     Name::        Agency::     Relationship::     Contact Information:     Housing/Transportation Living arrangements for the past 2 months:  Single Family Home, Skilled Nursing Facility Source of Information:  Adult Children Patient Interpreter Needed:  None Criminal Activity/Legal Involvement Pertinent to Current Situation/Hospitalization:  No - Comment as needed Significant Relationships:  Adult Children Lives with:  Self Do you feel safe going back to the place where you live?    Need for family participation in patient care:  Yes (Comment)  Care giving concerns:  Patient recently discharged to [facility] for STR.   Social Worker assessment / plan:  Patient transitioned from ICU to medical floor yesterday evening. Patient is confused and CSW received return phone call from patient's stepdaughter: Catherine Harris: [phone]. CSW explained role and purpose of phone call and Catherine Harris verified patient was short term rehab at [facility]. She stated that the social worker at [facility] had begun a medicaid application with her. She stated that patient was not very cooperative while she was at [facility] and that [facility] staff had a difficult time getting her to participate in rehab or even in ADL's. She would like for her to return to [facility] but realizes that patient may not participate in therapy. Patient's stepdaughter is also coping with the change in behavior that patient has begun to exhibit towards her. Catherine Harris states that patient has allegedly become quite hateful towards her and calls her "a murderer" and "she only wants to take my money". Catherine Harris states she has never spoken to her like that in  the past. CSW provided supportive counseling.   Catherine Harris is aware that PT will need to assess patient here and go from there. She is also aware that we will have to obtain re-auth from [facility]. CSW spoke with Jomarie LongsJoseph at [facility] and they can accept patient back. Jomarie LongsJoseph did not mention that there was any concern with patient's rehab participation previously.   Employment status:  Retired Database administratornsurance information:  Managed Medicare PT Recommendations:    Information / Referral to community resources:     Patient/Family's Response to care:  Catherine Harris expressed appreciation for CSW assistance.  Patient/Family's Understanding of and Emotional Response to Diagnosis, Current Treatment, and Prognosis:  Patient has limited understanding of her situation as she is confused. Catherine Harris is aware of patient's limitations and is aware of obstacles.  Emotional Assessment Appearance:  Appears stated age Attitude/Demeanor/Rapport:    Affect (typically observed):    Orientation:  Oriented to Self, Oriented to Place Alcohol / Substance use:  Not Applicable Psych involvement (Current and /or in the community):  No (Comment)  Discharge Needs  Concerns to be addressed:  Care Coordination Readmission within the last 30 days:  Yes Current discharge risk:  None Barriers to Discharge:  No Barriers Identified   York SpanielMonica Jaaliyah Lucatero, LCSW 06/07/2017, 8:18 AM

## 2017-06-07 NOTE — Evaluation (Signed)
Physical Therapy Evaluation Patient Details Name: Catherine Harris MRN: [id number] DOB: 05-03-1936 Today's Date: 06/07/2017   History of Present Illness  Pt presented to ER with SOB, disorientation, and confusion; pt admitted with respiratory failure secondary to lower left lobe HCAP. PMH: A-fib, HTN, COPD, dementia, stroke.  Clinical Impression  Prior to admission pt reports able to ambulate short distances with RW with assistance at [facility]. Pt reports no family support at home. Pt currently requires +2 assist with bed mobility for supine-to-sit and sit-to-supine. Pt reports 8/10 back pain and nausea which limited pt's activity during session (nursing notified). Pt required some encouragement to participate but did participate and was occasionally smiling during session. Pt will benefit from further PT to address strength and functional activity impairments (see below for details). Recommended discharge to SNF.    Follow Up Recommendations SNF    Equipment Recommendations  Rolling walker with 5" wheels    Recommendations for Other Services       Precautions / Restrictions Precautions Precautions: Fall Restrictions Weight Bearing Restrictions: No Other Position/Activity Restrictions: up as tolerated      Mobility  Bed Mobility Overal bed mobility: Needs Assistance Bed Mobility: Supine to Sit;Sit to Supine     Supine to sit: +2 for physical assistance;HOB elevated Sit to supine: +2 for physical assistance;HOB elevated   General bed mobility comments: mod assist to trunk and min-to-mod for B LE for supine-to-sit and sit-to-supine  Transfers                 General transfer comment: deferred due to pt complaints of nausea and significant back pain (8/10)  Ambulation/Gait             General Gait Details: deferred due to pt complaints of nausea and significant back pain (8/10)  Stairs            Wheelchair Mobility    Modified Rankin (Stroke  Patients Only)       Balance Overall balance assessment: Needs assistance Sitting-balance support: Single extremity supported;Feet unsupported Sitting balance-Leahy Scale: Good Sitting balance - Comments: pt able to perform sitting balance with B UE supported and with single (L) UE while brushing hair with R UE with no overt loss of balance                                     Pertinent Vitals/Pain Pain Assessment: 0-10 Pain Score: 8  Pain Location: back Pain Descriptors / Indicators: Grimacing;Sore;Tightness Pain Intervention(s): Limited activity within patient's tolerance;Monitored during session;Repositioned;Patient requesting pain meds-RN notified  HR - 70 to 82 bpm during this session via pulse ox O2 - 90 to 91% on 4 L O2 during this session via pulse ox    Home Living Family/patient expects to be discharged to:: Skilled nursing facility Living Arrangements: Alone   Type of Home: House Home Access: Stairs to enter Entrance Stairs-Rails: Left Entrance Stairs-Number of Steps: 5 Home Layout: One level Home Equipment: Walker - 2 wheels Additional Comments: pt reported, no family available during session to verify    Prior Function Level of Independence: Independent with assistive device(s)         Comments: modified independent for ADLs and home activity; recently at [facility] requiring assistance with ADLs (pt unable to report amount of assistance)     Hand Dominance        Extremity/Trunk Assessment   Upper Extremity  Assessment Upper Extremity Assessment: Overall WFL for tasks assessed    Lower Extremity Assessment Lower Extremity Assessment: Generalized weakness       Communication   Communication: No difficulties  Cognition Arousal/Alertness: Awake/alert Behavior During Therapy: WFL for tasks assessed/performed Overall Cognitive Status: No family/caregiver present to determine baseline cognitive functioning                                  General Comments: pt oriented to first name initially (pt gave correct last name at end of session), unable to recall location, year, month, day; pt able to state she is in hospital with multiple choice; no family/caregiver present to verify information obtained.      General Comments      Exercises     Assessment/Plan    PT Assessment Patient needs continued PT services  PT Problem List Decreased strength;Decreased activity tolerance;Decreased balance;Decreased mobility;Decreased cognition;Decreased knowledge of use of DME;Decreased safety awareness;Decreased knowledge of precautions;Pain       PT Treatment Interventions DME instruction;Gait training;Stair training;Functional mobility training;Therapeutic activities;Therapeutic exercise;Balance training;Patient/family education    PT Goals (Current goals can be found in the Care Plan section)  Acute Rehab PT Goals Patient Stated Goal: to increase strength PT Goal Formulation: With patient Time For Goal Achievement: 06/21/17 Potential to Achieve Goals: Fair    Frequency Min 2X/week   Barriers to discharge Decreased caregiver support      Co-evaluation               AM-PAC PT "6 Clicks" Daily Activity  Outcome Measure Difficulty turning over in bed (including adjusting bedclothes, sheets and blankets)?: Total Difficulty moving from lying on back to sitting on the side of the bed? : Total Difficulty sitting down on and standing up from a chair with arms (e.g., wheelchair, bedside commode, etc,.)?: Total Help needed moving to and from a bed to chair (including a wheelchair)?: Total Help needed walking in hospital room?: Total Help needed climbing 3-5 steps with a railing? : Total 6 Click Score: 6    End of Session Equipment Utilized During Treatment: Gait belt;Oxygen Activity Tolerance: Patient tolerated treatment well Patient left: in bed;with call bell/phone within reach;with bed alarm set Nurse  Communication: Mobility status;Patient requests pain meds (mobility status via whiteboard) PT Visit Diagnosis: Muscle weakness (generalized) (M62.81);History of falling (Z91.81);Other abnormalities of gait and mobility (R26.89)    Time: [id number] PT Time Calculation (min) (ACUTE ONLY): 35 min   Charges:         PT G CodesGwenlyn Found:        Catherine Harris, SPT 06/07/17,3:33 PM [phone]

## 2017-06-08 ENCOUNTER — Inpatient Hospital Stay: Payer: Medicare Other

## 2017-06-08 DIAGNOSIS — L8990 Pressure ulcer of unspecified site, unspecified stage: Secondary | ICD-10-CM | POA: Insufficient documentation

## 2017-06-08 MED ORDER — POLYETHYLENE GLYCOL 3350 17 G PO PACK
17.0000 g | PACK | Freq: Two times a day (BID) | ORAL | Status: DC
  Administered 2017-06-08 – 2017-06-09 (×2): 17 g via ORAL
  Filled 2017-06-08 (×2): qty 1

## 2017-06-08 MED ORDER — FUROSEMIDE 40 MG PO TABS
60.0000 mg | ORAL_TABLET | Freq: Two times a day (BID) | ORAL | Status: DC
  Administered 2017-06-08: 18:00:00 60 mg via ORAL
  Filled 2017-06-08 (×3): qty 1

## 2017-06-08 NOTE — Progress Notes (Signed)
SUBJECTIVE: Pt is still short of breath, no chest pain. Abdominal pain reported.   Vitals:   06/07/17 2314 06/08/17 0500 06/08/17 0735 06/08/17 0833  BP: 112/75   (!) 156/79  Pulse: 70   61  Resp: 18   18  Temp: 97.6 F (36.4 C)   97.6 F (36.4 C)  TempSrc: Oral   Oral  SpO2: 92%  94% 91%  Weight:  177 lb 1.6 oz (80.3 kg)    Height:        Intake/Output Summary (Last 24 hours) at 06/08/17 0840 Last data filed at 06/08/17 0608  Gross per 24 hour  Intake             2370 ml  Output              800 ml  Net             1570 ml    LABS: Basic Metabolic Panel:  Recent Labs  16/08/9606/11/18 0451  NA 139  K 4.2  CL 107  CO2 26  GLUCOSE 98  BUN 37*  CREATININE 1.02*  CALCIUM 8.3*   Liver Function Tests: No results for input(s): AST, ALT, ALKPHOS, BILITOT, PROT, ALBUMIN in the last 72 hours. No results for input(s): LIPASE, AMYLASE in the last 72 hours. CBC:  Recent Labs  06/06/17 0332  WBC 15.3*  HGB 14.7  HCT 44.5  MCV 86.7  PLT 125*   Cardiac Enzymes: No results for input(s): CKTOTAL, CKMB, CKMBINDEX, TROPONINI in the last 72 hours. BNP: Invalid input(s): POCBNP D-Dimer: No results for input(s): DDIMER in the last 72 hours. Hemoglobin A1C: No results for input(s): HGBA1C in the last 72 hours. Fasting Lipid Panel: No results for input(s): CHOL, HDL, LDLCALC, TRIG, CHOLHDL, LDLDIRECT in the last 72 hours. Thyroid Function Tests: No results for input(s): TSH, T4TOTAL, T3FREE, THYROIDAB in the last 72 hours.  Invalid input(s): FREET3 Anemia Panel: No results for input(s): VITAMINB12, FOLATE, FERRITIN, TIBC, IRON, RETICCTPCT in the last 72 hours.   PHYSICAL EXAM General: Well developed, well nourished, in no acute distress HEENT:  Normocephalic and atramatic Neck:  No JVD.  Lungs: Wheezing, rales bilaterally Heart: HRRR . 4/6 murmur pulmonic area Abdomen:  abdomen soft and tender  Msk:  Back normal, normal gait. Normal strength and tone for  age. Extremities: No clubbing, cyanosis or edema.   Neuro: Alert and oriented X 3. Psych:  Good affect, responds appropriately  TELEMETRY: Afib ventricular rate 66bpm  ASSESSMENT AND PLAN: Afib, rate is well controlled. Taking Eliquis for anticoagulation. Continue to support acute respiratory failure.    Principal Problem:   Acute on chronic respiratory failure (HCC) Active Problems:   HCAP (healthcare-associated pneumonia)   Atrial fibrillation, chronic (HCC)   COPD exacerbation (HCC)    Caroleen HammanKristin Taylore Hinde, NP-C 06/08/2017 8:40 AM

## 2017-06-08 NOTE — Progress Notes (Signed)
[facility] Physicians - [place] at [facility]   PATIENT NAME: Catherine Harris    MR#:  [id number]  DATE OF BIRTH:  04/30/1936  CHIEF COMPLAINT:   Chief Complaint  Patient presents with  . Shortness of Breath  . Chest Pain   Has baseline mild dementia. Seems to be worsening over the past few weeks. Today she complains of vague abdominal pain. Is unable to tell me more about this. No bowel movement in 4 days. No nausea.  REVIEW OF SYSTEMS:    Review of Systems  Constitutional: Negative for chills and fever.  HENT: Negative for hearing loss.   Eyes: Negative for blurred vision, double vision and photophobia.  Respiratory: Negative for cough, hemoptysis and shortness of breath.   Cardiovascular: Negative for palpitations, orthopnea and leg swelling.  Gastrointestinal: Positive for nausea. Negative for abdominal pain, diarrhea and vomiting.  Genitourinary: Negative for dysuria and urgency.  Musculoskeletal: Negative for myalgias and neck pain.  Skin: Negative for rash.  Neurological: Negative for dizziness, focal weakness, seizures, weakness and headaches.  Psychiatric/Behavioral: Negative for memory loss. The patient does not have insomnia.     Nutrition:  Tolerating Diet: Tolerating PT:      DRUG ALLERGIES:  No Known Allergies  VITALS:  Blood pressure (!) 156/79, pulse 61, temperature 97.6 F (36.4 C), temperature source Oral, resp. rate 18, height 5\' 5"  (1.651 m), weight 80.3 kg (177 lb 1.6 oz), SpO2 91 %.  PHYSICAL EXAMINATION:   Physical Exam  GENERAL:  81 y.o.-year-old patient lying in the bed with no acute distress.  EYES: Pupils equal, round, reactive to light . No scleral icterus. Extraocular muscles intact.  HEENT: Head atraumatic, normocephalic. Oropharynx and nasopharynx clear.  NECK:  Supple, no jugular venous distention. No thyroid enlargement, no tenderness.  LUNGS: Normal breath sounds bilaterally, no wheezing, rales,rhonchi or  crepitation. No use of accessory muscles of respiration.  CARDIOVASCULAR: S1, S2 normal. No murmurs, rubs, or gallops.  ABDOMEN: Soft, nontender, nondistended. Bowel sounds present. No organomegaly or mass.  EXTREMITIES: No pedal edema, cyanosis, or clubbing.  NEUROLOGIC: Moves all 4 extremities PSYCHIATRIC: confused at baseline SKIN: No obvious rash, lesion, or ulcer.    LABORATORY PANEL:   CBC  Recent Labs Lab 06/06/17 0332  WBC 15.3*  HGB 14.7  HCT 44.5  PLT 125*   ------------------------------------------------------------------------------------------------------------------  Chemistries   Recent Labs Lab 06/05/17 0213 06/07/17 0451  NA 137 139  K 4.3 4.2  CL 101 107  CO2 27 26  GLUCOSE 161* 98  BUN 25* 37*  CREATININE 1.03* 1.02*  CALCIUM 8.6* 8.3*  AST 23  --   ALT 17  --   ALKPHOS 84  --   BILITOT 1.5*  --    ------------------------------------------------------------------------------------------------------------------  Cardiac Enzymes  Recent Labs Lab 06/05/17 0213  TROPONINI 0.04*   ------------------------------------------------------------------------------------------------------------------  RADIOLOGY:  No results found.   ASSESSMENT AND PLAN:   Principal Problem:   Acute on chronic respiratory failure (HCC) Active Problems:   HCAP (healthcare-associated pneumonia)   Atrial fibrillation, chronic (HCC)   COPD exacerbation (HCC)   Pressure injury of skin   # Acute on chronic respiratory failure secondary to Left lower lobe pneumonia: Clinically improving, pro-calcitonin level less than 0.10 Wean off  oxygen,now on 3 L of oxygen, saturation 99%.  # Acute on chronic diastolic congestive heart failure. We will start Lasix twice a day today. Stop IV fluids.  # Atrial fibrillation supra Therapeutic INR on admission Started on Eliquis   #  Dementia: Continue Aricept . # COPD exacerbation No wheezing. We will stop steroids  today.  Seen by pulmonary, cardiology. Echo shows EF 75%.  # Elevated troponins due to demand ischemia. Seen by cardiology. No wall motion abnormalities on echocardiogram. No further investigations needed.  All the records are reviewed and case discussed with Care Management/Social Worker Management plans discussed with the patient, family and they are in agreement.  CODE STATUS: FULL CODE  TOTAL TIME TAKING CARE OF THIS PATIENT: 35 minutes.   Likely discharge tomorrow to rehabilitation  [name], [name] on 06/08/2017 at 12:58 PM  Between 7am to 6pm - Pager - [phone]  After 6pm go to [email or web address] - password [email or web address]  [name] [place] [facility]  Office  [phone]  CC: Primary care physician; System, Pcp Not In

## 2017-06-08 NOTE — Clinical Social Work Note (Signed)
CSW sent in clinicals [facility]. CSW will continue to follow.   Dede QuerySarah Mischa Pollard, MSW, LCSW  Clinical Social Worker  [phone]

## 2017-06-09 ENCOUNTER — Inpatient Hospital Stay: Payer: Medicare Other

## 2017-06-09 DIAGNOSIS — F0391 Unspecified dementia with behavioral disturbance: Secondary | ICD-10-CM

## 2017-06-09 DIAGNOSIS — Z515 Encounter for palliative care: Secondary | ICD-10-CM

## 2017-06-09 DIAGNOSIS — J441 Chronic obstructive pulmonary disease with (acute) exacerbation: Secondary | ICD-10-CM

## 2017-06-09 DIAGNOSIS — J189 Pneumonia, unspecified organism: Secondary | ICD-10-CM

## 2017-06-09 DIAGNOSIS — Z7189 Other specified counseling: Secondary | ICD-10-CM

## 2017-06-09 DIAGNOSIS — J9620 Acute and chronic respiratory failure, unspecified whether with hypoxia or hypercapnia: Secondary | ICD-10-CM

## 2017-06-09 LAB — CBC WITH DIFFERENTIAL/PLATELET
BASOS ABS: 0 10*3/uL (ref 0–0.1)
BASOS PCT: 0 %
EOS ABS: 0.1 10*3/uL (ref 0–0.7)
Eosinophils Relative: 1 %
HEMATOCRIT: 44.3 % (ref 35.0–47.0)
HEMOGLOBIN: 14.7 g/dL (ref 12.0–16.0)
Lymphocytes Relative: 8 %
Lymphs Abs: 0.5 10*3/uL — ABNORMAL LOW (ref 1.0–3.6)
MCH: 28.3 pg (ref 26.0–34.0)
MCHC: 33.1 g/dL (ref 32.0–36.0)
MCV: 85.6 fL (ref 80.0–100.0)
MONOS PCT: 9 %
Monocytes Absolute: 0.6 10*3/uL (ref 0.2–0.9)
NEUTROS ABS: 5.5 10*3/uL (ref 1.4–6.5)
NEUTROS PCT: 82 %
Platelets: 132 10*3/uL — ABNORMAL LOW (ref 150–440)
RBC: 5.17 MIL/uL (ref 3.80–5.20)
RDW: 15.6 % — AB (ref 11.5–14.5)
WBC: 6.7 10*3/uL (ref 3.6–11.0)

## 2017-06-09 LAB — BASIC METABOLIC PANEL
ANION GAP: 7 (ref 5–15)
BUN: 36 mg/dL — ABNORMAL HIGH (ref 6–20)
CO2: 28 mmol/L (ref 22–32)
CREATININE: 0.93 mg/dL (ref 0.44–1.00)
Calcium: 8.5 mg/dL — ABNORMAL LOW (ref 8.9–10.3)
Chloride: 105 mmol/L (ref 101–111)
GFR, EST NON AFRICAN AMERICAN: 57 mL/min — AB (ref >60)
Glucose, Bld: 91 mg/dL (ref 65–99)
Potassium: 3.9 mmol/L (ref 3.5–5.1)
Sodium: 140 mmol/L (ref 135–145)

## 2017-06-09 LAB — BLOOD GAS, ARTERIAL
ACID-BASE EXCESS: 3.4 mmol/L — AB (ref 0.0–2.0)
BICARBONATE: 29.1 mmol/L — AB (ref 20.0–28.0)
FIO2: 0.4
O2 Saturation: 85.7 %
PATIENT TEMPERATURE: 37
pCO2 arterial: 47 mmHg (ref 32.0–48.0)
pH, Arterial: 7.4 (ref 7.350–7.450)
pO2, Arterial: 51 mmHg — ABNORMAL LOW (ref 83.0–108.0)

## 2017-06-09 LAB — CULTURE, BLOOD (ROUTINE X 2)
CULTURE: NO GROWTH
CULTURE: NO GROWTH
SPECIAL REQUESTS: ADEQUATE
Special Requests: ADEQUATE

## 2017-06-09 MED ORDER — FUROSEMIDE 40 MG PO TABS: 60.0000 mg | ORAL_TABLET | Freq: Two times a day (BID) | ORAL | Status: DC

## 2017-06-09 MED ORDER — IPRATROPIUM-ALBUTEROL 0.5-2.5 (3) MG/3ML IN SOLN
3.0000 mL | Freq: Four times a day (QID) | RESPIRATORY_TRACT | Status: DC
  Administered 2017-06-09 – 2017-06-11 (×8): 3 mL via RESPIRATORY_TRACT
  Filled 2017-06-09 (×8): qty 3

## 2017-06-09 MED ORDER — POLYETHYLENE GLYCOL 3350 17 G PO PACK
17.0000 g | PACK | Freq: Every day | ORAL | Status: DC
  Administered 2017-06-10 – 2017-06-13 (×4): 17 g via ORAL
  Filled 2017-06-09 (×4): qty 1

## 2017-06-09 MED ORDER — LACTULOSE 10 GM/15ML PO SOLN: 30.0000 g | Freq: Once | ORAL | Status: DC

## 2017-06-09 MED ORDER — FUROSEMIDE 10 MG/ML IJ SOLN
60.0000 mg | Freq: Once | INTRAMUSCULAR | Status: AC
  Administered 2017-06-09: 60 mg via INTRAVENOUS
  Filled 2017-06-09: qty 8

## 2017-06-09 MED ORDER — IOPAMIDOL (ISOVUE-300) INJECTION 61%
15.0000 mL | INTRAVENOUS | Status: AC
  Administered 2017-06-09: 15 mL via ORAL

## 2017-06-09 MED ORDER — POLYETHYLENE GLYCOL 3350 17 G PO PACK: 17.0000 g | PACK | Freq: Every day | ORAL | Status: DC

## 2017-06-09 MED ORDER — IOPAMIDOL (ISOVUE-300) INJECTION 61%
100.0000 mL | Freq: Once | INTRAVENOUS | Status: AC | PRN
  Administered 2017-06-09: 100 mL via INTRAVENOUS

## 2017-06-09 MED ORDER — METHYLPREDNISOLONE SODIUM SUCC 125 MG IJ SOLR
60.0000 mg | INTRAMUSCULAR | Status: DC
  Administered 2017-06-09 – 2017-06-11 (×3): 60 mg via INTRAVENOUS
  Filled 2017-06-09 (×3): qty 2

## 2017-06-09 NOTE — Progress Notes (Addendum)
[facility] Physicians - [place] at [facility]   PATIENT NAME: Catherine Harris    MR#:  [id number]  DATE OF BIRTH:  10/08/1936  CHIEF COMPLAINT:   Chief Complaint  Patient presents with  . Shortness of Breath  . Chest Pain   Has baseline mild dementia.  Worsening SOB today. Complains of vague abd pain.  REVIEW OF SYSTEMS:    Review of Systems  Constitutional: Negative for chills and fever.  HENT: Negative for hearing loss.   Eyes: Negative for blurred vision, double vision and photophobia.  Respiratory: Negative for cough, hemoptysis and shortness of breath.   Cardiovascular: Negative for palpitations, orthopnea and leg swelling.  Gastrointestinal: Positive for nausea. Negative for abdominal pain, diarrhea and vomiting.  Genitourinary: Negative for dysuria and urgency.  Musculoskeletal: Negative for myalgias and neck pain.  Skin: Negative for rash.  Neurological: Negative for dizziness, focal weakness, seizures, weakness and headaches.  Psychiatric/Behavioral: Negative for memory loss. The patient does not have insomnia.    DRUG ALLERGIES:  No Known Allergies  VITALS:  Blood pressure (!) 143/90, pulse 70, temperature 98 F (36.7 C), temperature source Oral, resp. rate 16, height 5\' 5"  (1.651 m), weight 80.3 kg (177 lb 1.6 oz), SpO2 95 %.  PHYSICAL EXAMINATION:   Physical Exam  GENERAL:  81 y.o.-year-old patient lying in the bed with some resp distress EYES: Pupils equal, round, reactive to light . No scleral icterus. Extraocular muscles intact.  HEENT: Head atraumatic, normocephalic. Oropharynx and nasopharynx clear.  NECK:  Supple, no jugular venous distention. No thyroid enlargement, no tenderness.  LUNGS: Increased WOB. Decreased air entry. Wheezing and crackles CARDIOVASCULAR: S1, S2 normal. No murmurs, rubs, or gallops.  ABDOMEN: Soft, nondistended. Bowel sounds present. No organomegaly or mass. Diffuse tenderness. No rigidity or  gaurding EXTREMITIES: No pedal edema, cyanosis, or clubbing.  NEUROLOGIC: Moves all 4 extremities PSYCHIATRIC: confused at baseline SKIN: No obvious rash, lesion, or ulcer.   LABORATORY PANEL:   CBC  Recent Labs Lab 06/09/17 0405  WBC 6.7  HGB 14.7  HCT 44.3  PLT 132*   ------------------------------------------------------------------------------------------------------------------  Chemistries   Recent Labs Lab 06/05/17 0213  06/09/17 0405  NA 137  < > 140  K 4.3  < > 3.9  CL 101  < > 105  CO2 27  < > 28  GLUCOSE 161*  < > 91  BUN 25*  < > 36*  CREATININE 1.03*  < > 0.93  CALCIUM 8.6*  < > 8.5*  AST 23  --   --   ALT 17  --   --   ALKPHOS 84  --   --   BILITOT 1.5*  --   --   < > = values in this interval not displayed. ------------------------------------------------------------------------------------------------------------------  Cardiac Enzymes  Recent Labs Lab 06/05/17 0213  TROPONINI 0.04*   ------------------------------------------------------------------------------------------------------------------  RADIOLOGY:  Dg Chest 2 View  Result Date: 06/08/2017 CLINICAL DATA:  Shortness of breath. EXAM: CHEST  2 VIEW COMPARISON:  06/05/2017 FINDINGS: Chronic cardiomegaly. Bilateral effusions with dependent atelectasis left worse than right. Less venous hypertension. No acute bone finding. IMPRESSION: Slight improvement in the congestive heart failure pattern. Persisting cardiomegaly and effusions with lower lung atelectasis left more than right. Electronically Signed   By: Paulina Fusi M.D.   On: 06/08/2017 14:19   Dg Abd 2 Views  Result Date: 06/08/2017 CLINICAL DATA:  Abdominal pain.  Constipation. EXAM: ABDOMEN - 2 VIEW COMPARISON:  None FINDINGS: Bowel gas pattern within  normal limits. Moderate amount of fecal matter in the colon. Small bowel gas pattern normal. No abnormal soft tissue calcifications. Previous lumbar spinal fusion. No acute bone  finding. IMPRESSION: Moderate amount of fecal matter in the colon. Small bowel gas pattern normal. Electronically Signed   By: Paulina FusiMark  Shogry M.D.   On: 06/08/2017 14:19   ASSESSMENT AND PLAN:   Principal Problem:   Acute on chronic respiratory failure (HCC) Active Problems:   HCAP (healthcare-associated pneumonia)   Atrial fibrillation, chronic (HCC)   COPD exacerbation (HCC)   Pressure injury of skin   # Acute on chronic respiratory failure secondary to Left lower lobe pneumonia: Clinically improving, pro-calcitonin level less than 0.10 IV abx stopped. Worsening today. Likely CHF and COPD.  # Acute on chronic diastolic congestive heart failure.  Her worsening breathing seems to be due to CHF. Will give STAT dose of Lasix IV 60mg  x 1.  # Atrial fibrillation supra Therapeutic INR on admission Started on Eliquis   # Dementia: Continue Aricept . # COPD exacerbation Worsening today. Start IV steroids  Seen by pulmonary, cardiology. Echo shows EF 75%.  # Abdominal pain- etiology unclear. Tender to palpation. Nausea. Poor appetite. Abd xray showed nothing acute. Will check Ct abdomen  # Elevated troponins due to demand ischemia. Seen by cardiology. No wall motion abnormalities on echocardiogram. No further investigations needed.  All the records are reviewed and case discussed with Care Management/Social Worker Management plans discussed with the patient, family and they are in agreement.  CODE STATUS: FULL CODE  TOTAL CRITICAL CARE TIME  : 35 minutes.   Milagros LollSudini, Hridaan Bouse [name] on 06/09/2017 at 10:03 AM  Between 7am to 6pm - Pager - [phone]  After 6pm go to [email or web address] - password [email or web address]  [name] [place] [facility]  Office  [phone]  CC: Primary care physician; System, Pcp Not In

## 2017-06-09 NOTE — Progress Notes (Signed)
PT Cancellation Note  Patient Details Name: Catherine Harris MRN: [id number] DOB: 1935/12/07   Cancelled Treatment:    Reason Eval/Treat Not Completed: Medical issues which prohibited therapy (pt's O2 levels not stable). Pt chart reviewed. Nursing states trouble keeping O2 levels up even with increase in O2 (to 5 L). Nursing suggests hold at this time. Will re-attempt PT at later date/time.   Gwenlyn FoundRiley Virginio Isidore, SPT 06/09/17,2:27 PM [phone]

## 2017-06-09 NOTE — Clinical Social Work Note (Signed)
[facility] and sent in additional clinicals. Per MD, possible discharge tomorrow. [employer] is aware. CSW will continue to follow.   Dede QuerySarah Ravynn Hogate, MSW, LCSW Clinical Social Worker [phone]

## 2017-06-09 NOTE — Clinical Social Work Note (Signed)
CSW was updated by MD that pt will remain inpatient through the weekend as pt is on 6L of oxygen. CSW updated [employer]. [facility] also [facility] and they will cancel current request and a new auth will need to be started on Monday. CSW will continue to follow.   Dede QuerySarah Blasa Raisch, MSW, LCSW  Clinical Social Worker [phone]

## 2017-06-09 NOTE — Progress Notes (Signed)
Patient refusing to take contrast. MD notified and wants CT done without contrast.   Harvie HeckMelanie Deaja Rizo, RN

## 2017-06-09 NOTE — Progress Notes (Signed)
Patient O2 saturations in 80's on 4LO2, O2 in low 90's on 5L O2. Lips dusky, patient lethargic. Patient breathing very shallow. Unable to eat food, no appetite and poor output. Respiratory therapist administering breathing treating and teaching patient to use incentive spirometer. RN and respiratory therapist slid patient up in bed and has her sitting upright. MD notified and placed orders for lasix and ABG's. RN will continue to monitor patient.   Harvie HeckMelanie Ronetta Molla, RN

## 2017-06-09 NOTE — Progress Notes (Signed)
SUBJECTIVE: Patient is still very short of breath and having epigastric pain F2   Vitals:   06/08/17 0735 06/08/17 0833 06/08/17 1921 06/09/17 0754  BP:  (!) 156/79  (!) 143/90  Pulse:  61  70  Resp:  18  16  Temp:  97.6 F (36.4 C)  98 F (36.7 C)  TempSrc:  Oral  Oral  SpO2: 94% 91% 91% 95%  Weight:      Height:        Intake/Output Summary (Last 24 hours) at 06/09/17 0856 Last data filed at 06/08/17 1230  Gross per 24 hour  Intake              360 ml  Output               50 ml  Net              310 ml    LABS: Basic Metabolic Panel:  Recent Labs  16/08/9606/11/18 0451 06/09/17 0405  NA 139 140  K 4.2 3.9  CL 107 105  CO2 26 28  GLUCOSE 98 91  BUN 37* 36*  CREATININE 1.02* 0.93  CALCIUM 8.3* 8.5*   Liver Function Tests: No results for input(s): AST, ALT, ALKPHOS, BILITOT, PROT, ALBUMIN in the last 72 hours. No results for input(s): LIPASE, AMYLASE in the last 72 hours. CBC:  Recent Labs  06/09/17 0405  WBC 6.7  NEUTROABS 5.5  HGB 14.7  HCT 44.3  MCV 85.6  PLT 132*   Cardiac Enzymes: No results for input(s): CKTOTAL, CKMB, CKMBINDEX, TROPONINI in the last 72 hours. BNP: Invalid input(s): POCBNP D-Dimer: No results for input(s): DDIMER in the last 72 hours. Hemoglobin A1C: No results for input(s): HGBA1C in the last 72 hours. Fasting Lipid Panel: No results for input(s): CHOL, HDL, LDLCALC, TRIG, CHOLHDL, LDLDIRECT in the last 72 hours. Thyroid Function Tests: No results for input(s): TSH, T4TOTAL, T3FREE, THYROIDAB in the last 72 hours.  Invalid input(s): FREET3 Anemia Panel: No results for input(s): VITAMINB12, FOLATE, FERRITIN, TIBC, IRON, RETICCTPCT in the last 72 hours.   PHYSICAL EXAM General: Well developed, well nourished, in no acute distress HEENT:  Normocephalic and atramatic Neck:  No JVD.  Lungs: Clear bilaterally to auscultation and percussion. Heart: HRRR . Normal S1 and S2 without gallops or murmurs.  Abdomen: Bowel sounds are  positive, abdomen soft and non-tender  Msk:  Back normal, normal gait. Normal strength and tone for age. Extremities: No clubbing, cyanosis or edema.   Neuro: Alert and oriented X 3. Psych:  Good affect, responds appropriately  TELEMETRY: A. fib with controlled ventricular rate  ASSESSMENT AND PLAN: Atrial fibrillation with controlled ventricular rate and COPD and left lower lobe pneumonia. Cardiac point of view ejection fraction was normal, but has severely dilated right side with dilated right atrium and right ventricle and severe pulmonary hypertension. Patient also has significant left pleural effusion which may be contributing to shortness of breath.  Principal Problem:   Acute on chronic respiratory failure (HCC) Active Problems:   HCAP (healthcare-associated pneumonia)   Atrial fibrillation, chronic (HCC)   COPD exacerbation (HCC)   Pressure injury of skin    Catherine Harris A, MD, [facility] 06/09/2017 8:56 AM

## 2017-06-09 NOTE — Consult Note (Signed)
Consultation Note Date: 06/09/2017   Patient Name: Catherine Harris  DOB: 09-09-1936  MRN: [id number]  Age / Sex: 81 y.o., female  PCP: System, Pcp Not In Referring Physician: Hillary Bow, MD  Reason for Consultation: Establishing goals of care  HPI/Patient Profile: 81 y.o. female  with past medical history of COPD on home oxygen 2L, afib, CHF, CVA, HTN, HLD, GERD, CKD, and dementia admitted on 06/04/2017 with shortness of breath and weakness. In ED, chest xray revealed LLL pneumonia. Patient with hypoxia and placed on face mask. IV antibiotics, steroids, and nebulizers initiated. On 7/13, patient required increase from 2L to 5L due to worsening respiratory status likely contributing to CHF and COPD. Lasix '60mg'$  given. Also with complaints of abdominal pain. Abdominal xray negative. CT abdomen pending. Elevated troponins due to demand ischemia. FULL code. Palliative medicine consultation for goals of care.   Clinical Assessment and Goals of Care: I have reviewed medical records, discussed with Dr. Darvin Neighbours, and initially met with patient at bedside. Patient was awake, alert, answers questions and follows commands. Some baseline dementia and answers "I don't know" to multiple questions including code status. Tells me "I don't have anyone" when I asked about support system.   After visiting with the patient, I spoke with step-daughter Bonnita Nasuti Hellen) via telephone. Introduced Palliative Medicine as specialized medical care for people living with serious illness. It focuses on providing relief from the symptoms and stress of a serious illness. The goal is to improve quality of life for both the patient and the family.  We discussed a brief life review of the patient. Lives alone. Her (ex) husband is deceased and she does not have any children. Her ex-husband's daughter Bonnita Nasuti) has remained her support system even  after they were divorced. Helen's kids see Ms. Mutch as a grandmother. Recently diagnosed with dementia that has been progressing with increased forgetfulness and behavioral disturbance (verbally abusive to family/home health nurses and refusing care). Infection was not found, so doctors have contributed this to progressing dementia. Also with COPD and CHF and recently started on continuous oxygen at 2L.   Discussed hospital diagnoses, interventions, and underlying co-morbidities. Bonnita Nasuti has a good understanding that COPD, CHF, and dementia are chronic progressive diseases.   Advanced directives, concepts specific to code status, and artifical feeding and hydration were considered and discussed. Bonnita Nasuti tells me Ms. Loria has a living will that names Bonnita Nasuti as "executor" of her finances. She does NOT have a documented HCPOA or spoken of her wishes on resuscitation/life support. Bonnita Nasuti has attempted to discuss this with her and she never wanted to talk about it. Bonnita Nasuti tells me if her breathing decompensated requiring intubation, she would NOT want this for her. Also, if her heart were to stop, she would NOT want her resuscitated. She still does not feel comfortable changing her code status while she is awake and able to participate in conversations. "I don't want to speak for her."   I attempted to elicit values and goals of care important to the  patient. Her home and dogs are most important to her. She has been "100% independent" and "closed off" her entire life. Bonnita Nasuti does not think she would want her life prolonged long-term by artificial means. Bonnita Nasuti states "I think she is scared" and the patient told Bonnita Nasuti "I'm dying" last week.   Palliative Care services outpatient were explained and offered. Strongly encouraged Bonnita Nasuti to visit this weekend and attempt conversation regarding advanced directives/code status with the patient. She understands the importance of these conversations as chronic  co-morbidities continue to progress.   Questions and concerns were addressed. Emotional support provided.     SUMMARY OF RECOMMENDATIONS    FULL code/ FULL scope treatment  No documented HCPOA or advanced directives.   Strongly encouraged step-daughter, Bonnita Nasuti, to visit hospital this weekend and discuss code status, advanced directives, and EOL wishes if the patient is able to participate in conversation.   Bonnita Nasuti agreeable with continued support from palliative through hospitalization and outpatient on discharge.   PMT not at [facility] over the weekend but will f/u next week if still hospitalized.   Code Status/Advance Care Planning:  Full code-educated on medical recommendation for DNR/DNI with age and chronic co-morbidities  Symptom Management:   Per attending  Palliative Prophylaxis:   Aspiration, Delirium Protocol, Frequent Pain Assessment, Oral Care and Turn Reposition  Additional Recommendations (Limitations, Scope, Preferences):  Full Scope Treatment  Psycho-social/Spiritual:   Desire for further Chaplaincy support: no  Additional Recommendations: Caregiving  Support/Resources  Prognosis:   Unable to determine  Discharge Planning: To Be Determined      Primary Diagnoses: Present on Admission: **None**   I have reviewed the medical record, interviewed the patient and family, and examined the patient. The following aspects are pertinent.  Past Medical History:  Diagnosis Date  . A-fib ([place])   . CKD (chronic kidney disease) stage 3, GFR 30-59 ml/min   . COPD (chronic obstructive pulmonary disease) ([place])   . GERD (gastroesophageal reflux disease)   . Hyperlipidemia   . Hypertension   . IGT (impaired glucose tolerance)   . Renal disorder   . Sleep apnea   . Stroke ([place])   . Thyroid disease    Social History   Social History  . Marital status: Widowed    Spouse name: N/A  . Number of children: N/A  . Years of education: N/A   Social History Main  Topics  . Smoking status: Former Research scientist (life sciences)  . Smokeless tobacco: Never Used  . Alcohol use None  . Drug use: Unknown  . Sexual activity: Not Asked   Other Topics Concern  . None   Social History Narrative  . None   History reviewed. No pertinent family history. Scheduled Meds: . apixaban  5 mg Oral BID  . docusate sodium  100 mg Oral BID  . donepezil  5 mg Oral QHS  . DULoxetine  30 mg Oral Daily  . fesoterodine  4 mg Oral Daily  . furosemide  60 mg Intravenous Once  . [START ON 06/10/2017] furosemide  60 mg Oral BID  . ipratropium-albuterol  3 mL Nebulization Q6H  . lisinopril  5 mg Oral Daily  . Melatonin  2.5 mg Oral QHS  . methylPREDNISolone (SOLU-MEDROL) injection  60 mg Intravenous Q24H  . metoprolol tartrate  50 mg Oral BID  . mometasone-formoterol  2 puff Inhalation BID  . montelukast  10 mg Oral QHS  . pantoprazole  40 mg Oral QAC breakfast  . polyethylene glycol  17 g  Oral Daily  . simvastatin  20 mg Oral QPM  . traZODone  50 mg Oral QHS   Continuous Infusions: PRN Meds:.acetaminophen **OR** acetaminophen, bisacodyl, LORazepam, ondansetron **OR** ondansetron (ZOFRAN) IV Medications Prior to Admission:  Prior to Admission medications   Medication Sig Start Date End Date Taking? Authorizing Provider  donepezil (ARICEPT) 5 MG tablet Take 5 mg by mouth at bedtime.   Yes [provider]  DULoxetine (CYMBALTA) 30 MG capsule Take 30 mg by mouth daily.   Yes [provider]  furosemide (LASIX) 20 MG tablet Take 20 mg by mouth daily. 05/08/17  Yes [provider]  lisinopril (PRINIVIL,ZESTRIL) 5 MG tablet Take 5 mg by mouth daily. 03/17/17  Yes [provider]  Melatonin 3 MG TABS Take 3 mg by mouth at bedtime.   Yes [provider]  metoprolol tartrate (LOPRESSOR) 50 MG tablet Take 50 mg by mouth 2 (two) times daily. 02/23/17  Yes [provider]  Multiple Vitamin (MULTI-VITAMINS) TABS Take 1 tablet by mouth daily.   Yes  [provider]  omeprazole (PRILOSEC) 20 MG capsule Take 20 mg by mouth daily. 03/17/17  Yes [provider]  simvastatin (ZOCOR) 20 MG tablet Take 20 mg by mouth every evening.  03/10/17  Yes [provider]  tolterodine (DETROL LA) 2 MG 24 hr capsule Take 1 capsule by mouth daily. 11/22/16  Yes [provider]  traZODone (DESYREL) 50 MG tablet Take 50 mg by mouth at bedtime.   Yes [provider]  warfarin (COUMADIN) 3 MG tablet Take 3 mg by mouth daily. 03/17/17  Yes [provider]   No Known Allergies Review of Systems  Unable to perform ROS: Mental status change   Physical Exam  Constitutional: She is cooperative. She appears ill.  HENT:  Head: Normocephalic and atraumatic.  Cardiovascular: An irregularly irregular rhythm present.  Pulmonary/Chest: No accessory muscle usage. No tachypnea. No respiratory distress. She has decreased breath sounds.  5L [place]  Abdominal: Normal appearance.  Neurological: She is alert. She is disoriented.  Skin: Skin is warm and dry.  Psychiatric: Her speech is delayed. She is withdrawn. She exhibits a depressed mood.  Nursing note and vitals reviewed.  Vital Signs: BP 134/79 (BP Location: Left Arm)   Pulse 69   Temp 97.8 F (36.6 C) (Oral)   Resp (!) 22   Ht '5\' 5"'$  (1.651 m)   Wt 80.3 kg (177 lb 1.6 oz)   SpO2 90%   BMI 29.47 kg/m  Pain Assessment: 0-10   Pain Score: 7   SpO2: SpO2: 90 % O2 Device:SpO2: 90 % O2 Flow Rate: .O2 Flow Rate (L/min): 5 L/min  IO: Intake/output summary: No intake or output data in the 24 hours ending 06/09/17 1532  LBM: Last BM Date: 06/08/17 Baseline Weight: Weight: 79.5 kg (175 lb 4.3 oz) Most recent weight: Weight: 80.3 kg (177 lb 1.6 oz)     Palliative Assessment/Data: PPS 40%   Flowsheet Rows     Most Recent Value  Intake Tab  Referral Department  Hospitalist  Unit at Time of Referral  Cardiac/Telemetry Unit  Palliative Care Primary Diagnosis   Other (Comment) [COPD, CHF]  Palliative Care Type  New Palliative care  Reason for referral  Clarify Goals of Care  Date of Admission  06/04/17  Date first seen by Palliative Care  06/09/17  Clinical Assessment  Palliative Performance Scale Score  40%  Psychosocial & Spiritual Assessment  Palliative Care Outcomes  Patient/Family  meeting held?  Yes  Who was at the meeting?  patient and step-daughter via telephone  Palliative Care Outcomes  Clarified goals of care, Provided end of life care assistance, Provided psychosocial or spiritual support, ACP counseling assistance      Time In/Out: 7573-2256 Time Total: 18mn Greater than 50%  of this time was spent counseling and coordinating care related to the above assessment and plan.  Signed by:  MIhor Dow FNP-C Palliative Medicine Team  Phone: [phone]: [phone]  Please contact Palliative Medicine Team phone at [phone] questions and concerns.  For individual provider: See AShea Evans

## 2017-06-10 LAB — BASIC METABOLIC PANEL
ANION GAP: 8 (ref 5–15)
BUN: 33 mg/dL — ABNORMAL HIGH (ref 6–20)
CHLORIDE: 99 mmol/L — AB (ref 101–111)
CO2: 32 mmol/L (ref 22–32)
Calcium: 8.5 mg/dL — ABNORMAL LOW (ref 8.9–10.3)
Creatinine, Ser: 1.05 mg/dL — ABNORMAL HIGH (ref 0.44–1.00)
GFR, EST AFRICAN AMERICAN: 57 mL/min — AB (ref >60)
GFR, EST NON AFRICAN AMERICAN: 50 mL/min — AB (ref >60)
Glucose, Bld: 116 mg/dL — ABNORMAL HIGH (ref 65–99)
POTASSIUM: 4.5 mmol/L (ref 3.5–5.1)
SODIUM: 139 mmol/L (ref 135–145)

## 2017-06-10 MED ORDER — FUROSEMIDE 10 MG/ML IJ SOLN
40.0000 mg | Freq: Two times a day (BID) | INTRAMUSCULAR | Status: DC
  Administered 2017-06-10 – 2017-06-12 (×5): 40 mg via INTRAVENOUS
  Filled 2017-06-10 (×5): qty 4

## 2017-06-10 NOTE — Progress Notes (Signed)
Pt alert and oriented. Pt still remaining on 5L of oxgen this shift. Pt was able to tolerated bipap without difficulty. No complaints of pain this shift. Pt putting out large amounts of urine this shift.

## 2017-06-10 NOTE — Progress Notes (Signed)
[facility] [place] [facility]   PATIENT NAME: Catherine FarrierMary Harris    MR#:  [id number]  DATE OF BIRTH:  Jan 20, 1936  CHIEF COMPLAINT:  Complains of abdominal pain, shortness of breath. Abdominal CAT scan showed bilateral pleural effusion but nothing intra-abdominal. Source.. Chief Complaint  Patient presents with  . Shortness of Breath  . Chest Pain   Has baseline mild dementia.  Worsening SOB today. Complains of vague abd pain.  REVIEW OF SYSTEMS:    Review of Systems  Constitutional: Negative for chills and fever.  HENT: Negative for hearing loss.   Eyes: Negative for blurred vision, double vision and photophobia.  Respiratory: Negative for cough, hemoptysis and shortness of breath.   Cardiovascular: Negative for palpitations, orthopnea and leg swelling.  Gastrointestinal: Positive for nausea. Negative for abdominal pain, diarrhea and vomiting.  Genitourinary: Negative for dysuria and urgency.  Musculoskeletal: Negative for myalgias and neck pain.  Skin: Negative for rash.  Neurological: Negative for dizziness, focal weakness, seizures, weakness and headaches.  Psychiatric/Behavioral: Negative for memory loss. The patient does not have insomnia.    DRUG ALLERGIES:  No Known Allergies  VITALS:  Blood pressure (!) 142/78, pulse 67, temperature 97.7 F (36.5 C), temperature source Oral, resp. rate 20, height 5\' 5"  (1.651 m), weight 76.9 kg (169 lb 8 oz), SpO2 95 %.  PHYSICAL EXAMINATION:   Physical Exam  GENERAL:  81 y.o.-year-old patient lying in the bed with some resp distress EYES: Pupils equal, round, reactive to light . No scleral icterus. Extraocular muscles intact.  HEENT: Head atraumatic, normocephalic. Oropharynx and nasopharynx clear.  NECK:  Supple, no jugular venous distention. No thyroid enlargement, no tenderness.  LUNGS:Decreased breath sounds bilaterally, rales bilaterally. No wheezing.  CARDIOVASCULAR: S1, S2 normal. No  murmurs, rubs, or gallops.  ABDOMEN: Soft, nondistended. Bowel sounds present. No organomegaly or mass. Diffuse tenderness. No rigidity or gaurding EXTREMITIES: No pedal edema, cyanosis, or clubbing.  NEUROLOGIC: Moves all 4 extremities PSYCHIATRIC: confused at baseline SKIN: No obvious rash, lesion, or ulcer.   LABORATORY PANEL:   CBC  Recent Labs Lab 06/09/17 0405  WBC 6.7  HGB 14.7  HCT 44.3  PLT 132*   ------------------------------------------------------------------------------------------------------------------  Chemistries   Recent Labs Lab 06/05/17 0213  06/10/17 0423  NA 137  < > 139  K 4.3  < > 4.5  CL 101  < > 99*  CO2 27  < > 32  GLUCOSE 161*  < > 116*  BUN 25*  < > 33*  CREATININE 1.03*  < > 1.05*  CALCIUM 8.6*  < > 8.5*  AST 23  --   --   ALT 17  --   --   ALKPHOS 84  --   --   BILITOT 1.5*  --   --   < > = values in this interval not displayed. ------------------------------------------------------------------------------------------------------------------  Cardiac Enzymes  Recent Labs Lab 06/05/17 0213  TROPONINI 0.04*   ------------------------------------------------------------------------------------------------------------------  RADIOLOGY:  Dg Chest 2 View  Result Date: 06/08/2017 CLINICAL DATA:  Shortness of breath. EXAM: CHEST  2 VIEW COMPARISON:  06/05/2017 FINDINGS: Chronic cardiomegaly. Bilateral effusions with dependent atelectasis left worse than right. Less venous hypertension. No acute bone finding. IMPRESSION: Slight improvement in the congestive heart failure pattern. Persisting cardiomegaly and effusions with lower lung atelectasis left more than right. Electronically Signed   By: Paulina FusiMark  Shogry M.D.   On: 06/08/2017 14:19   Ct Abdomen Pelvis W Contrast  Result Date: 06/09/2017 CLINICAL DATA:  Generalized abdominal pain, nausea. EXAM: CT ABDOMEN AND PELVIS WITH CONTRAST TECHNIQUE: Multidetector CT imaging of the abdomen and  pelvis was performed using the standard protocol following bolus administration of intravenous contrast. CONTRAST:  ISOVUE-300 IOPAMIDOL (ISOVUE-300) INJECTION 61% COMPARISON:  None available currently. FINDINGS: Lower chest: Moderate bilateral pleural effusions are noted with adjacent subsegmental atelectasis. Mild cardiomegaly is noted. Hepatobiliary: No focal liver abnormality is seen. Heterogeneous appearance of hepatic parenchyma is noted suggesting diffuse hepatocellular disease or fatty infiltration. No gallstones, gallbladder wall thickening, or biliary dilatation. Enlargement of hepatic veins is noted as well as right atrium suggesting pulmonary venous hypertension. Pancreas: Unremarkable. No pancreatic ductal dilatation or surrounding inflammatory changes. Spleen: Normal in size without focal abnormality. Adrenals/Urinary Tract: Adrenal glands are unremarkable. Kidneys are normal, without renal calculi, focal lesion, or hydronephrosis. Bladder is unremarkable. Stomach/Bowel: No bowel dilatation or inflammation is noted. The appendix is not clearly identified. Vascular/Lymphatic: Aortic atherosclerosis. No enlarged abdominal or pelvic lymph nodes. Reproductive: Status post hysterectomy. No adnexal masses. Other: Mild anasarca is noted. No hernias are noted. Small amount of free fluid is noted posteriorly in the pelvis. Musculoskeletal: No acute or significant osseous findings. IMPRESSION: Moderate bilateral pleural effusions are noted with adjacent subsegmental atelectasis. Heterogeneous appearance of hepatic parenchyma is noted suggesting diffuse hepatocellular disease or fatty infiltration. Aortic atherosclerosis. Mild anasarca. Mild amount of free fluid is noted posteriorly in the pelvis. Enlargement of hepatic veins and right atrium is noted suggesting pulmonary artery hypertension or other right-sided heart disease. No other definite abnormality seen in the abdomen or pelvis. Electronically  Signed   By: Lupita Raider, M.D.   On: 06/09/2017 13:00   Dg Abd 2 Views  Result Date: 06/08/2017 CLINICAL DATA:  Abdominal pain.  Constipation. EXAM: ABDOMEN - 2 VIEW COMPARISON:  None FINDINGS: Bowel gas pattern within normal limits. Moderate amount of fecal matter in the colon. Small bowel gas pattern normal. No abnormal soft tissue calcifications. Previous lumbar spinal fusion. No acute bone finding. IMPRESSION: Moderate amount of fecal matter in the colon. Small bowel gas pattern normal. Electronically Signed   By: Paulina Fusi M.D.   On: 06/08/2017 14:19   ASSESSMENT AND PLAN:   Principal Problem:   Acute on chronic respiratory failure (HCC) Active Problems:   HCAP (healthcare-associated pneumonia)   Atrial fibrillation, chronic (HCC)   COPD exacerbation (HCC)   Pressure injury of skin   Community acquired pneumonia   Palliative care by specialist   Goals of care, counseling/discussion   # Acute on chronic respiratory failure secondary to Left lower lobe pneumonia: Clinically improving, pro-calcitonin level less than 0.10 IV abx stopped.   # Acute on chronic diastolic congestive heart failure.add  IV Lasix, wean off oxygen as tolerated.  # Atrial fibrillation supra Therapeutic INR on admission Started on Eliquis   # Dementia: Continue Aricept . # COPD exacerbation Worsening today. Start IV steroids  Seen by pulmonary, cardiology. Echo shows EF 75%.  # Abdominal pain- due to pleural effusion. Has dementia but unable to tell where her abdomen is hurting.    # Elevated troponins due to demand ischemia. Seen by cardiology. No wall motion abnormalities on echocardiogram. No further investigations needed.  All the records are reviewed and case discussed with Care Management/Social Worker Management plans discussed with the patient, family and they are in agreement.  CODE STATUS: FULL CODE  TOTAL CRITICAL CARE TIME  : 35 minutes.   Katha Hamming M.D on  06/10/2017 at 8:54 AM  Between  7am to 6pm - Pager - [phone]  After 6pm go to [email or web address] - password [email or web address]  [facility]  Office  [phone]  CC: Primary care physician; System, Pcp Not In

## 2017-06-10 NOTE — Progress Notes (Signed)
Pt taken off of bipap, gave neb, then placed on 5 liter [place] for today. Pt tol well at this time.

## 2017-06-10 NOTE — Progress Notes (Signed)
Physical Therapy Treatment Patient Details Name: Catherine Harris MRN: [id number] DOB: Apr 17, 1936 Today's Date: 06/10/2017    History of Present Illness Pt presented to ER with SOB, disorientation, and confusion; pt admitted with respiratory failure secondary to lower left lobe HCAP. PMH: A-fib, HTN, COPD, dementia, stroke.    PT Comments    Pt in bed on 5 lpm sats 95% at rest.  Pt initially declines therapy stating she is nauseous and generally does not feel well.  She did agree to minimal supine isometric exercises as described below.  She generally kept her eyes closed throughout session.  Will continue as appropriate.   Follow Up Recommendations  SNF     Equipment Recommendations  Rolling walker with 5" wheels    Recommendations for Other Services       Precautions / Restrictions Precautions Precautions: Fall Restrictions Weight Bearing Restrictions: No Other Position/Activity Restrictions: up as tolerated    Mobility  Bed Mobility                  Transfers                    Ambulation/Gait                 Stairs            Wheelchair Mobility    Modified Rankin (Stroke Patients Only)       Balance                                            Cognition Arousal/Alertness: Lethargic Behavior During Therapy: WFL for tasks assessed/performed Overall Cognitive Status: Within Functional Limits for tasks assessed                                        Exercises Other Exercises Other Exercises: Pt agreed to limited exercises - ankle pumps, quad and glut sets x 10    General Comments        Pertinent Vitals/Pain Pain Assessment: No/denies pain    Home Living                      Prior Function            PT Goals (current goals can now be found in the care plan section) Progress towards PT goals: Not progressing toward goals - comment    Frequency    Min  2X/week      PT Plan Current plan remains appropriate    Co-evaluation              AM-PAC PT "6 Clicks" Daily Activity  Outcome Measure  Difficulty turning over in bed (including adjusting bedclothes, sheets and blankets)?: Total Difficulty moving from lying on back to sitting on the side of the bed? : Total Difficulty sitting down on and standing up from a chair with arms (e.g., wheelchair, bedside commode, etc,.)?: Total Help needed moving to and from a bed to chair (including a wheelchair)?: Total Help needed walking in hospital room?: Total Help needed climbing 3-5 steps with a railing? : Total 6 Click Score: 6    End of Session   Activity Tolerance: Patient limited by fatigue Patient left: in bed;with bed alarm set;with call bell/phone within  reach         Time: 1005-1014 PT Time Calculation (min) (ACUTE ONLY): 9 min  Charges:  $Therapeutic Exercise: 8-22 mins                    G Codes:       Danielle Dess, PTA 06/10/17, 10:19 AM

## 2017-06-11 MED ORDER — METHYLPREDNISOLONE SODIUM SUCC 40 MG IJ SOLR
40.0000 mg | INTRAMUSCULAR | Status: DC
  Administered 2017-06-12 – 2017-06-13 (×2): 40 mg via INTRAVENOUS
  Filled 2017-06-11 (×2): qty 1

## 2017-06-11 MED ORDER — IPRATROPIUM-ALBUTEROL 0.5-2.5 (3) MG/3ML IN SOLN: 3.0000 mL | Freq: Four times a day (QID) | RESPIRATORY_TRACT | Status: DC | PRN

## 2017-06-11 MED ORDER — IPRATROPIUM-ALBUTEROL 0.5-2.5 (3) MG/3ML IN SOLN
3.0000 mL | Freq: Three times a day (TID) | RESPIRATORY_TRACT | Status: DC
  Administered 2017-06-11 – 2017-06-13 (×7): 3 mL via RESPIRATORY_TRACT
  Filled 2017-06-11 (×7): qty 3

## 2017-06-11 NOTE — Progress Notes (Signed)
Patient is A&O x2, confused at times. O2 at 5L, sats 91-92%. External cath in place, draining clear amber urine. Fair diet. Meds in applesauce. LBm 7/14. Tele in place. Refusing to get OOB. Strongly encouraged IS. Bed alarm on for safety. Fine crackles. Scheduled nebs.

## 2017-06-11 NOTE — Progress Notes (Signed)
[facility] [place] at [facility]   PATIENT NAME: Catherine FarrierMary Harris    MR#:  [id number]  DATE OF BIRTH:  [birth year]  CHIEF COMPLAINT:  Patient is seen at bedside, no abdominal pain or nausea today. Breathing comfortably today. Chief Complaint  Patient presents with  . Shortness of Breath  . Chest Pain   Has baseline mild dementia.   REVIEW OF SYSTEMS:    Review of Systems  Constitutional: Negative for chills and fever.  HENT: Negative for hearing loss.   Eyes: Negative for blurred vision, double vision and photophobia.  Respiratory: Negative for cough, hemoptysis and shortness of breath.   Cardiovascular: Negative for palpitations, orthopnea and leg swelling.  Gastrointestinal: Negative for abdominal pain, diarrhea and vomiting.  Genitourinary: Negative for dysuria and urgency.  Musculoskeletal: Negative for myalgias and neck pain.  Skin: Negative for rash.  Neurological: Negative for dizziness, focal weakness, seizures, weakness and headaches.  Psychiatric/Behavioral: Negative for memory loss. The patient does not have insomnia.    DRUG ALLERGIES:  No Known Allergies  VITALS:  Blood pressure 125/68, pulse 66, temperature 98.2 F (36.8 C), temperature source Oral, resp. rate 20, height 5\' 5"  (1.651 m), weight 76.5 kg (168 lb 11.2 oz), SpO2 96 %.  PHYSICAL EXAMINATION:   Physical Exam  GENERAL:  81 y.o.-year-old patient lying in the bed with some resp distress EYES: Pupils equal, round, reactive to light . No scleral icterus. Extraocular muscles intact.  HEENT: Head atraumatic, normocephalic. Oropharynx and nasopharynx clear.  NECK:  Supple, no jugular venous distention. No thyroid enlargement, no tenderness.  LUNGS:Decreased breath sounds bilaterally, rales bilaterally. No wheezing.  CARDIOVASCULAR: S1, S2 normal. No murmurs, rubs, or gallops.  ABDOMEN: Soft, nondistended. Bowel sounds present. No organomegaly or mass. Diffuse tenderness. No  rigidity or gaurding EXTREMITIES: No pedal edema, cyanosis, or clubbing.  NEUROLOGIC: Moves all 4 extremities PSYCHIATRIC: confused at baseline SKIN: No obvious rash, lesion, or ulcer.   LABORATORY PANEL:   CBC  Recent Labs Lab 06/09/17 0405  WBC 6.7  HGB 14.7  HCT 44.3  PLT 132*   ------------------------------------------------------------------------------------------------------------------  Chemistries   Recent Labs Lab 06/05/17 0213  06/10/17 0423  NA 137  < > 139  K 4.3  < > 4.5  CL 101  < > 99*  CO2 27  < > 32  GLUCOSE 161*  < > 116*  BUN 25*  < > 33*  CREATININE 1.03*  < > 1.05*  CALCIUM 8.6*  < > 8.5*  AST 23  --   --   ALT 17  --   --   ALKPHOS 84  --   --   BILITOT 1.5*  --   --   < > = values in this interval not displayed. ------------------------------------------------------------------------------------------------------------------  Cardiac Enzymes  Recent Labs Lab 06/05/17 0213  TROPONINI 0.04*   ------------------------------------------------------------------------------------------------------------------  RADIOLOGY:  Ct Abdomen Pelvis W Contrast  Result Date: 06/09/2017 CLINICAL DATA:  Generalized abdominal pain, nausea. EXAM: CT ABDOMEN AND PELVIS WITH CONTRAST TECHNIQUE: Multidetector CT imaging of the abdomen and pelvis was performed using the standard protocol following bolus administration of intravenous contrast. CONTRAST:  100mL ISOVUE-300 IOPAMIDOL (ISOVUE-300) INJECTION 61% COMPARISON:  None available currently. FINDINGS: Lower chest: Moderate bilateral pleural effusions are noted with adjacent subsegmental atelectasis. Mild cardiomegaly is noted. Hepatobiliary: No focal liver abnormality is seen. Heterogeneous appearance of hepatic parenchyma is noted suggesting diffuse hepatocellular disease or fatty infiltration. No gallstones, gallbladder wall thickening, or biliary dilatation. Enlargement of  hepatic veins is noted as well as  right atrium suggesting pulmonary venous hypertension. Pancreas: Unremarkable. No pancreatic ductal dilatation or surrounding inflammatory changes. Spleen: Normal in size without focal abnormality. Adrenals/Urinary Tract: Adrenal glands are unremarkable. Kidneys are normal, without renal calculi, focal lesion, or hydronephrosis. Bladder is unremarkable. Stomach/Bowel: No bowel dilatation or inflammation is noted. The appendix is not clearly identified. Vascular/Lymphatic: Aortic atherosclerosis. No enlarged abdominal or pelvic lymph nodes. Reproductive: Status post hysterectomy. No adnexal masses. Other: Mild anasarca is noted. No hernias are noted. Small amount of free fluid is noted posteriorly in the pelvis. Musculoskeletal: No acute or significant osseous findings. IMPRESSION: Moderate bilateral pleural effusions are noted with adjacent subsegmental atelectasis. Heterogeneous appearance of hepatic parenchyma is noted suggesting diffuse hepatocellular disease or fatty infiltration. Aortic atherosclerosis. Mild anasarca. Mild amount of free fluid is noted posteriorly in the pelvis. Enlargement of hepatic veins and right atrium is noted suggesting pulmonary artery hypertension or other right-sided heart disease. No other definite abnormality seen in the abdomen or pelvis. Electronically Signed   By: Lupita Raider, M.D.   On: 06/09/2017 13:00   ASSESSMENT AND PLAN:   Principal Problem:   Acute on chronic respiratory failure (HCC) Active Problems:   HCAP (healthcare-associated pneumonia)   Atrial fibrillation, chronic (HCC)   COPD exacerbation (HCC)   Pressure injury of skin   Community acquired pneumonia   Palliative care by specialist   Goals of care, counseling/discussion   # Acute on chronic respiratory failure secondary to Left lower lobe pneumonia: Clinically improving, pro-calcitonin level less than 0.10 IV abx stopped.   # Acute on chronic diastolic congestive heart failure.added IV  Lasix yesterday, wean off oxygen as tolerated. possible [facility] or Tuesday. If   stable.  # Atrial fibrillation supra Therapeutic INR on admission,stopped warfarin, Started on Eliquis   # Dementia: Continue Aricept,cymbalta . # COPD exacerbation improving  Seen by pulmonary, cardiology. Echo shows EF 75%.  # Abdominal pain- due to pleural effusion. Has dementia but unable to tell where her abdomen is hurting.    # Elevated troponins due to demand ischemia. Seen by cardiology. No wall motion abnormalities on echocardiogram. No further investigations needed.  All the records are reviewed and case discussed with Care Management/Social Worker Management plans discussed with the patient, family and they are in agreement.  CODE STATUS: FULL CODE  TOTAL CRITICAL CARE TIME  : 35 minutes.   Katha Hamming M.D on 06/11/2017 at 9:02 AM  Between 7am to 6pm - Pager - [phone]  After 6pm go to [email or web address] - password [email or web address]  [facility]  Office  [phone]  CC: Primary care physician; System, Pcp Not In

## 2017-06-11 NOTE — Progress Notes (Signed)
SUBJECTIVE: Patient is doing much better   Vitals:   06/10/17 2316 06/10/17 2354 06/11/17 0213 06/11/17 0512  BP: 125/68     Pulse: 66     Resp: 20     Temp: 98.2 F (36.8 C)     TempSrc: Oral     SpO2: 100% 96% 96%   Weight:    168 lb 11.2 oz (76.5 kg)  Height:        Intake/Output Summary (Last 24 hours) at 06/11/17 0846 Last data filed at 06/11/17 0703  Gross per 24 hour  Intake              480 ml  Output             1300 ml  Net             -820 ml    LABS: Basic Metabolic Panel:  Recent Labs  82/95/6207/13/18 0405 06/10/17 0423  NA 140 139  K 3.9 4.5  CL 105 99*  CO2 28 32  GLUCOSE 91 116*  BUN 36* 33*  CREATININE 0.93 1.05*  CALCIUM 8.5* 8.5*   Liver Function Tests: No results for input(s): AST, ALT, ALKPHOS, BILITOT, PROT, ALBUMIN in the last 72 hours. No results for input(s): LIPASE, AMYLASE in the last 72 hours. CBC:  Recent Labs  06/09/17 0405  WBC 6.7  NEUTROABS 5.5  HGB 14.7  HCT 44.3  MCV 85.6  PLT 132*   Cardiac Enzymes: No results for input(s): CKTOTAL, CKMB, CKMBINDEX, TROPONINI in the last 72 hours. BNP: Invalid input(s): POCBNP D-Dimer: No results for input(s): DDIMER in the last 72 hours. Hemoglobin A1C: No results for input(s): HGBA1C in the last 72 hours. Fasting Lipid Panel: No results for input(s): CHOL, HDL, LDLCALC, TRIG, CHOLHDL, LDLDIRECT in the last 72 hours. Thyroid Function Tests: No results for input(s): TSH, T4TOTAL, T3FREE, THYROIDAB in the last 72 hours.  Invalid input(s): FREET3 Anemia Panel: No results for input(s): VITAMINB12, FOLATE, FERRITIN, TIBC, IRON, RETICCTPCT in the last 72 hours.   PHYSICAL EXAM General: Well developed, well nourished, in no acute distress HEENT:  Normocephalic and atramatic Neck:  No JVD.  Lungs: Clear bilaterally to auscultation and percussion. Heart: HRRR . Normal S1 and S2 without gallops or murmurs.  Abdomen: Bowel sounds are positive, abdomen soft and non-tender  Msk:  Back  normal, normal gait. Normal strength and tone for age. Extremities: No clubbing, cyanosis or edema.   Neuro: Alert and oriented X 3. Psych:  Good affect, responds appropriately  TELEMETRY:Atrial fibrillation with controlled ventricular rate  ASSESSMENT AND PLAN: Atrial fibrillation with controlled ventricular rate dementia hypertension history of stroke presented with left lower lobe pneumonia clinically significantly improving.  Principal Problem:   Acute on chronic respiratory failure (HCC) Active Problems:   HCAP (healthcare-associated pneumonia)   Atrial fibrillation, chronic (HCC)   COPD exacerbation (HCC)   Pressure injury of skin   Community acquired pneumonia   Palliative care by specialist   Goals of care, counseling/discussion    Laurier NancyKHAN,Edwards Mckelvie A, MD, [facility] 06/11/2017 8:46 AM

## 2017-06-11 NOTE — Progress Notes (Signed)
Rounded in patient's room and patient stated that she wasn't feeling well. Checked O2 sats, were in the 60's. Checked O2 and found that patient's oxygen tubing wasn't in the wall, but oxygen was on.  Connected tubing into oxygen and patient's sats went to 91% on 5L. Notified RT.

## 2017-06-11 NOTE — Plan of Care (Signed)
Problem: Activity: Goal: Ability to tolerate increased activity will improve Outcome: Not Progressing Explained at length about patient getting OOB. Patient stating that she would prefer to stay in bed and just get some rest. Explained decondition and will discuss with PT.

## 2017-06-12 ENCOUNTER — Inpatient Hospital Stay: Payer: Medicare Other

## 2017-06-12 DIAGNOSIS — R109 Unspecified abdominal pain: Secondary | ICD-10-CM

## 2017-06-12 DIAGNOSIS — K5904 Chronic idiopathic constipation: Secondary | ICD-10-CM

## 2017-06-12 DIAGNOSIS — K5900 Constipation, unspecified: Secondary | ICD-10-CM

## 2017-06-12 DIAGNOSIS — Z66 Do not resuscitate: Secondary | ICD-10-CM

## 2017-06-12 DIAGNOSIS — R1031 Right lower quadrant pain: Secondary | ICD-10-CM

## 2017-06-12 DIAGNOSIS — Z7189 Other specified counseling: Secondary | ICD-10-CM

## 2017-06-12 DIAGNOSIS — Z515 Encounter for palliative care: Secondary | ICD-10-CM

## 2017-06-12 LAB — BASIC METABOLIC PANEL
Anion gap: 11 (ref 5–15)
BUN: 42 mg/dL — ABNORMAL HIGH (ref 6–20)
CALCIUM: 8.7 mg/dL — AB (ref 8.9–10.3)
CO2: 34 mmol/L — AB (ref 22–32)
CREATININE: 0.99 mg/dL (ref 0.44–1.00)
Chloride: 96 mmol/L — ABNORMAL LOW (ref 101–111)
GFR calc Af Amer: >60 mL/min (ref >60)
GFR calc non Af Amer: 53 mL/min — ABNORMAL LOW (ref >60)
GLUCOSE: 138 mg/dL — AB (ref 65–99)
Potassium: 3.7 mmol/L (ref 3.5–5.1)
Sodium: 141 mmol/L (ref 135–145)

## 2017-06-12 MED ORDER — SENNA 8.6 MG PO TABS
2.0000 | ORAL_TABLET | Freq: Every day | ORAL | Status: DC
  Administered 2017-06-12: 17.2 mg via ORAL
  Filled 2017-06-12: qty 2

## 2017-06-12 MED ORDER — BISACODYL 10 MG RE SUPP: 10.0000 mg | Freq: Once | RECTAL | Status: DC

## 2017-06-12 MED ORDER — FUROSEMIDE 10 MG/ML IJ SOLN
40.0000 mg | Freq: Every day | INTRAMUSCULAR | Status: DC
  Administered 2017-06-13: 40 mg via INTRAVENOUS
  Filled 2017-06-12: qty 4

## 2017-06-12 NOTE — Plan of Care (Signed)
Problem: Nutrition: Goal: Adequate nutrition will be maintained Outcome: Not Progressing Pt's appetite is fair. Not consuming much  Problem: Activity: Goal: Ability to tolerate increased activity will improve Outcome: Progressing Pt was up in chair today. 2 person assist

## 2017-06-12 NOTE — Progress Notes (Signed)
[facility]. Plan is for discharge tomorrow. Referral made aware. Thank you. Dayna BarkerKaren Robertson RN, BSN, [facility], [facility] Liaison [phone]

## 2017-06-12 NOTE — Progress Notes (Signed)
Daily Progress Note   Patient Name: Catherine Harris       Date: 06/12/2017 DOB: 05-25-1936  Age: 81 y.o. MRN#: [id number] Attending Physician: Houston Siren, MD Primary Care Physician: System, Pcp Not In Admit Date: 06/04/2017  Reason for Consultation/Follow-up: Establishing goals of care  Subjective: Patient is lying in bed. She is able to answers questions, but does not know why she is in the hospital or what is wrong with her health.  She tells me her sister in law threw her out of the house.  She reports that she is feeling "worse." When discussion code status she wants to be a full code to keep her alive. She reports that she does not have a HPOA and does not want her step-daughter, Myriam Jacobson, to be her HPOA. She also reports that she lives alone and has difficulty accessing the toilet and taking a shower. When asked how she bathes, she responds, "I don't." She reports that she is still having trouble breathing.   Per telephone conversation with Myriam Jacobson, closet next of kin / step-daughter, she describes that her memory has worsened and that she has noticed behavior changes the past month.  Per Myriam Jacobson, the patient has not seen her sister in law in over a year. She reports that she is the support network for the patient and helps her about twice a week, with managing her medications.   I explained that Catherine Harris does not have the decision making capacity to make complex decisions regarding her code status.  We discussed her D-HF, severe pulmonary HTN and valvular disease in addition to her underlying COPD and CKD.  These conditions will continue to become worse.  We made the medical recommendation to change her code status to DNR as she would likely not survive a code.  Her step-daughter agreed  and decided to make her a DNR.   Assessment: Patient is a 81 year old female who has dementia,d-HF, pulmonary HTN, and CKD.  She was admitted with LLL pneumonia.  She is depressed (started on Cymbalta 2 weeks ago) and confused.  On Bipap at night and 5L during the day today (7/16).  Complaining of abdominal pain.    Patient Profile/HPI: Catherine Harris is a 81 year old female who presented for left lower lobe pneumonia on 06/04/2017 from [facility].  She has a past medical history of COPD, A-fib, CKD stage 4, dementia and chronic respiratory failure.  She complains about diffuse abdominal pain.    Length of Stay: 8  Current Medications: Scheduled Meds:  . apixaban  5 mg Oral BID  . docusate sodium  100 mg Oral BID  . donepezil  5 mg Oral QHS  . DULoxetine  30 mg Oral Daily  . fesoterodine  4 mg Oral Daily  . furosemide  40 mg Intravenous BID  . ipratropium-albuterol  3 mL Nebulization TID  . lisinopril  5 mg Oral Daily  . Melatonin  2.5 mg Oral QHS  . methylPREDNISolone (SOLU-MEDROL) injection  40 mg Intravenous Q24H  . metoprolol tartrate  50 mg Oral BID  . mometasone-formoterol  2 puff Inhalation BID  . montelukast  10 mg Oral QHS  . pantoprazole  40 mg Oral QAC breakfast  . polyethylene glycol  17 g Oral Daily  . simvastatin  20 mg Oral QPM  . traZODone  50 mg Oral QHS    Continuous Infusions:   PRN Meds: acetaminophen **OR** acetaminophen, bisacodyl, ipratropium-albuterol, LORazepam, ondansetron **OR** ondansetron (ZOFRAN) IV  Physical Exam   The patient is lying in her bed. Awake, alert, but answers "I don't know" to most questions.  Pulm: decreased breath sounds. Coughing during the exam.  CV: murmur appreciated  Abdomen: mild tenderness to palpation. Soft, non distended.  Psych: She appears to be depressed.   Vital Signs: BP (!) 155/83 (BP Location: Left Arm)   Pulse 68   Temp 97.8 F (36.6 C) (Oral)   Resp 18   Ht 5\' 5"  (1.651 m)   Wt 76.7 kg (169 lb 3.2 oz)   SpO2  92%   BMI 28.16 kg/m  SpO2: SpO2: 92 % O2 Device: O2 Device: Nasal Cannula O2 Flow Rate: O2 Flow Rate (L/min): 5 L/min  Intake/output summary:  Intake/Output Summary (Last 24 hours) at 06/12/17 1610 Last data filed at 06/11/17 2339  Gross per 24 hour  Intake              480 ml  Output             1150 ml  Net             -670 ml   LBM: Last BM Date: 06/11/17 Baseline Weight: Weight: 79.5 kg (175 lb 4.3 oz) Most recent weight: Weight: 76.7 kg (169 lb 3.2 oz)       Palliative Assessment/Data:    Flowsheet Rows     Most Recent Value  Intake Tab  Referral Department  Hospitalist  Unit at Time of Referral  Cardiac/Telemetry Unit  Palliative Care Primary Diagnosis  Other (Comment) [COPD, CHF]  Palliative Care Type  New Palliative care  Reason for referral  Clarify Goals of Care  Date of Admission  06/04/17  Date first seen by Palliative Care  06/09/17  Clinical Assessment  Palliative Performance Scale Score  40%  Psychosocial & Spiritual Assessment  Palliative Care Outcomes  Patient/Family meeting held?  Yes  Who was at the meeting?  patient and step-daughter via telephone  Palliative Care Outcomes  Clarified goals of care, Provided end of life care assistance, Provided psychosocial or spiritual support, ACP counseling assistance      Patient Active Problem List   Diagnosis Date Noted  . Community acquired pneumonia   . Palliative care by specialist   . Goals of care, counseling/discussion   . Pressure injury of skin 06/08/2017  .  Acute on chronic respiratory failure (HCC) 06/04/2017  . HCAP (healthcare-associated pneumonia) 06/04/2017  . Atrial fibrillation, chronic (HCC) 06/04/2017  . COPD exacerbation (HCC) 06/04/2017  . Dementia 05/18/2017    Palliative Care Plan    Recommendations/Plan:  DNR  Will give ducolax supp and nightly senna for constipation / abdominal pain.  Please request Palliative Care to follow at SNF.  Goals of Care and Additional  Recommendations:  Limitations on Scope of Treatment: Full Scope Treatment  Code Status:  DNR  Prognosis:   < 6 months given debility, valvular disease, pulm HTN, COPD   Discharge Planning:  Skilled Nursing Facility for rehab with Palliative care service follow-up  Care plan was discussed with Step Dtr, Myriam JacobsonHelen  Thank you for allowing the Palliative Medicine Team to assist in the care of this patient.  Total time spent:  35 min.     Greater than 50%  of this time was spent counseling and coordinating care related to the above assessment and plan.  Caryl NeverMadison Tedrow, PA-S Norvel RichardsMarianne Joselyn Edling, [place] JerseyPA-C Palliative Medicine  Please contact Palliative MedicineTeam phone at [phone] for questions and concerns between 7 am - 7 pm.   Please see AMION for individual provider pager numbers.

## 2017-06-12 NOTE — Progress Notes (Signed)
Physical Therapy Treatment Patient Details Name: Catherine Harris MRN: [id number] DOB: 07-Jan-1936 Today's Date: 06/12/2017    History of Present Illness Pt presented to ER with SOB, disorientation, and confusion; pt admitted with respiratory failure secondary to lower left lobe HCAP. PMH: A-fib, HTN, COPD, dementia, stroke.    PT Comments    Pt requiring +2 assist to stand with RW briefly; limited time due to SOB. Pt able to perform squat-pivot transfer from EOB to bedside chair with +1 max assist; with minimal SOB. Pt requires verbal cues for proper B UE and B LE placement for transfer. Pt requires +1 mod assist for bed mobility. O2 sats 91 to 94% via pulse ox throughout session on 5 L via nasal cannula. Will benefit from continued skilled PT to address generalized weakness, fatigue, and progression of independence with transfers.   Follow Up Recommendations  SNF     Equipment Recommendations  Rolling walker with 5" wheels    Recommendations for Other Services       Precautions / Restrictions Precautions Precautions: Fall Restrictions Weight Bearing Restrictions: No Other Position/Activity Restrictions: up as tolerated    Mobility  Bed Mobility Overal bed mobility: Needs Assistance Bed Mobility: Supine to Sit     Supine to sit: Mod assist;HOB elevated     General bed mobility comments: mod assist supine-to-sit for trunk and min-to-mod assist for B LE supine-to-sit  Transfers Overall transfer level: Needs assistance Equipment used: Rolling walker (2 wheeled) Transfers: Sit to/from [employer] CorporationStand;Squat Pivot Transfers Sit to Stand: Mod assist;+2 physical assistance   Squat pivot transfers: Max assist     General transfer comment: pt able to perform sit-to-stand with +2 mod assist and vc's for B LE and UE placement; pt able to perform squat pivot transfer with +1 max assist to get from EOB to bedside chair for lunch with vc's for B UE placement on therapist's hips to help  facilitate transfer (lowered the L arm rest on chair)  Ambulation/Gait             General Gait Details: deferred due to pt fatigue   Stairs            Wheelchair Mobility    Modified Rankin (Stroke Patients Only)       Balance Overall balance assessment: Needs assistance Sitting-balance support: Single extremity supported;Feet supported Sitting balance-Leahy Scale: Good Sitting balance - Comments: pt able to perform static sitting balance with R UE support with no overt loss of balance   Standing balance support: Bilateral upper extremity supported Standing balance-Leahy Scale: Fair                              Cognition Arousal/Alertness: Awake/alert Behavior During Therapy: WFL for tasks assessed/performed Overall Cognitive Status: No family/caregiver present to determine baseline cognitive functioning                                 General Comments: pt oriented to self, place, situation; pt unable to recall day, month, year      Exercises      General Comments        Pertinent Vitals/Pain Pain Assessment: No/denies pain  HR - 68 to 80 bpm during session via pulse ox O2 - 91 to 94% on 5 L O2 via nasal cannula during session via pulse ox    Home Living  Prior Function            PT Goals (current goals can now be found in the care plan section) Acute Rehab PT Goals Patient Stated Goal: to increase strength PT Goal Formulation: With patient Time For Goal Achievement: 06/21/17 Potential to Achieve Goals: Fair Progress towards PT goals: Progressing toward goals    Frequency    Min 2X/week      PT Plan Current plan remains appropriate    Co-evaluation              AM-PAC PT "6 Clicks" Daily Activity  Outcome Measure  Difficulty turning over in bed (including adjusting bedclothes, sheets and blankets)?: Total Difficulty moving from lying on back to sitting on the side of  the bed? : Total Difficulty sitting down on and standing up from a chair with arms (e.g., wheelchair, bedside commode, etc,.)?: Total Help needed moving to and from a bed to chair (including a wheelchair)?: A Lot Help needed walking in hospital room?: Total Help needed climbing 3-5 steps with a railing? : Total 6 Click Score: 7    End of Session Equipment Utilized During Treatment: Gait belt;Oxygen Activity Tolerance: Patient limited by fatigue Patient left: in chair;with call bell/phone within reach;with chair alarm set Nurse Communication: Mobility status;Precautions (via whiteboard) PT Visit Diagnosis: Muscle weakness (generalized) (M62.81);History of falling (Z91.81);Other abnormalities of gait and mobility (R26.89)     Time: 1610-9604 PT Time Calculation (min) (ACUTE ONLY): 27 min  Charges:                       G CodesGwenlyn Found, SPT 07/10/17,1:53 PM [phone]

## 2017-06-12 NOTE — Progress Notes (Signed)
Alert and oriented. Confused at times. 02 on 5 lpm. Desat in 80's.  Tolerated BIPAP during the night. External catheter changed. Encouraged to use incentive spirometer.

## 2017-06-12 NOTE — Progress Notes (Signed)
SUBJECTIVE: Pt feeling weak, tired, but no chest pain and no acute shortness of breath.    Vitals:   06/11/17 2117 06/11/17 2355 06/12/17 0630 06/12/17 0800  BP:  123/86  (!) 155/83  Pulse:  78  68  Resp:  18  18  Temp:  98.1 F (36.7 C)  97.8 F (36.6 C)  TempSrc:  Oral  Oral  SpO2: 90% 91%  92%  Weight:   169 lb 3.2 oz (76.7 kg)   Height:        Intake/Output Summary (Last 24 hours) at 06/12/17 0932 Last data filed at 06/11/17 2339  Gross per 24 hour  Intake              480 ml  Output             1150 ml  Net             -670 ml    LABS: Basic Metabolic Panel:  Recent Labs  09/81/1907/14/18 0423  NA 139  K 4.5  CL 99*  CO2 32  GLUCOSE 116*  BUN 33*  CREATININE 1.05*  CALCIUM 8.5*   Liver Function Tests: No results for input(s): AST, ALT, ALKPHOS, BILITOT, PROT, ALBUMIN in the last 72 hours. No results for input(s): LIPASE, AMYLASE in the last 72 hours. CBC: No results for input(s): WBC, NEUTROABS, HGB, HCT, MCV, PLT in the last 72 hours. Cardiac Enzymes: No results for input(s): CKTOTAL, CKMB, CKMBINDEX, TROPONINI in the last 72 hours. BNP: Invalid input(s): POCBNP D-Dimer: No results for input(s): DDIMER in the last 72 hours. Hemoglobin A1C: No results for input(s): HGBA1C in the last 72 hours. Fasting Lipid Panel: No results for input(s): CHOL, HDL, LDLCALC, TRIG, CHOLHDL, LDLDIRECT in the last 72 hours. Thyroid Function Tests: No results for input(s): TSH, T4TOTAL, T3FREE, THYROIDAB in the last 72 hours.  Invalid input(s): FREET3 Anemia Panel: No results for input(s): VITAMINB12, FOLATE, FERRITIN, TIBC, IRON, RETICCTPCT in the last 72 hours.   PHYSICAL EXAM General:  in no acute distress, pale, weak HEENT:  Normocephalic and atramatic Neck:  No JVD.  Lungs: Clear bilaterally to auscultation and percussion. Heart: HRRR . Normal S1 and S2 without gallops or murmurs.  Abdomen: Bowel sounds are positive, abdomen soft and non-tender  Msk:  Back normal,  normal gait. Normal strength and tone for age. Extremities: No clubbing, cyanosis or edema.   Neuro: Lethargic, weak. Psych:  Good affect, responds appropriately  TELEMETRY: Atrial fibrillation 67bpm  ASSESSMENT AND PLAN: Chronic Afib with controlled rate. Tolerating Eliquis for anticoagulation. Continue to treat CHF with oral diuretics. Elevated troponin was due to demand ischemia. Blood pressure is well controlled.   Principal Problem:   Acute on chronic respiratory failure (HCC) Active Problems:   HCAP (healthcare-associated pneumonia)   Atrial fibrillation, chronic (HCC)   COPD exacerbation (HCC)   Pressure injury of skin   Community acquired pneumonia   Palliative care by specialist   Goals of care, counseling/discussion    Caroleen HammanKristin Efrain Clauson, NP-C 06/12/2017 9:32 AM

## 2017-06-12 NOTE — Progress Notes (Signed)
Patient is A&O x 3-4, forgetful at times. Changed to a DNR, bracelet in place. O2 at 4L, sats 92-93%. External cath removed, incont of urine. Tele in place, running A.Fib. Meds whole in applesauce. Up to chair this afternoon, assist x2 pivot transfer. SOB with activity, able to maintain sats. Fair diet. LBM 7/14. Bed/chair alarm on for safety.

## 2017-06-12 NOTE — Progress Notes (Addendum)
Per RN patient will likely D/C tomorrow. Clinical Child psychotherapistocial Worker (CSW) started [employer] [facility] authorization today. [facility] liaison is aware of above.  [facility], auth # [id number], RUC. [facility] liaison is aware of above.   Baker Hughes IncorporatedBailey Walter Grima, LCSW [phone]

## 2017-06-12 NOTE — Progress Notes (Signed)
[facility] - [place] at [facility]   PATIENT NAME: Catherine Harris    MR#:  [id number]  DATE OF BIRTH:  1936/04/03  SUBJECTIVE:   Patient here due to shortness of breath and noted to be in congestive heart failure. Shortness of breath significantly improved since admission. Still has a cough but is nonproductive. No other acute events or complaints present.   REVIEW OF SYSTEMS:    Review of Systems  Constitutional: Negative for chills and fever.  HENT: Negative for congestion and tinnitus.   Eyes: Negative for blurred vision and double vision.  Respiratory: Positive for cough. Negative for shortness of breath and wheezing.   Cardiovascular: Negative for chest pain, orthopnea and PND.  Gastrointestinal: Negative for abdominal pain, diarrhea, nausea and vomiting.  Genitourinary: Negative for dysuria and hematuria.  Neurological: Negative for dizziness, sensory change and focal weakness.  All other systems reviewed and are negative.   Nutrition: Regular Tolerating Diet: Yes Tolerating PT: Eval noted.    DRUG ALLERGIES:  No Known Allergies  VITALS:  Blood pressure (!) 155/83, pulse 68, temperature 97.8 F (36.6 C), temperature source Oral, resp. rate 18, height 5\' 5"  (1.651 m), weight 76.7 kg (169 lb 3.2 oz), SpO2 95 %.  PHYSICAL EXAMINATION:   Physical Exam  GENERAL:  81 y.o.-year-old patient lying in bed in no acute distress.  EYES: Pupils equal, round, reactive to light and accommodation. No scleral icterus. Extraocular muscles intact.  HEENT: Head atraumatic, normocephalic. Oropharynx and nasopharynx clear.  NECK:  Supple, no jugular venous distention. No thyroid enlargement, no tenderness.  LUNGS: Poor Resp. effort, no wheezing, rales, rhonchi. No use of accessory muscles of respiration.  CARDIOVASCULAR: S1, S2 normal. No murmurs, rubs, or gallops.  ABDOMEN: Soft, nontender, nondistended. Bowel sounds present. No organomegaly or mass.  EXTREMITIES:  No cyanosis, clubbing or edema b/l.    NEUROLOGIC: Cranial nerves II through XII are intact. No focal Motor or sensory deficits b/l.   PSYCHIATRIC: The patient is alert and oriented x 3.  SKIN: No obvious rash, lesion, or ulcer.    LABORATORY PANEL:   CBC  Recent Labs Lab 06/09/17 0405  WBC 6.7  HGB 14.7  HCT 44.3  PLT 132*   ------------------------------------------------------------------------------------------------------------------  Chemistries   Recent Labs Lab 06/12/17 0956  NA 141  K 3.7  CL 96*  CO2 34*  GLUCOSE 138*  BUN 42*  CREATININE 0.99  CALCIUM 8.7*   ------------------------------------------------------------------------------------------------------------------  Cardiac Enzymes No results for input(s): TROPONINI in the last 168 hours. ------------------------------------------------------------------------------------------------------------------  RADIOLOGY:  Dg Chest Port 1 View  Result Date: 06/12/2017 CLINICAL DATA:  Increasing shortness of Breath EXAM: PORTABLE CHEST 1 VIEW COMPARISON:  06/08/2017 FINDINGS: Cardiac shadow is stable. Persistent left lower lobe consolidation and small left effusion is seen and stable. Right lung is clear. No acute bony abnormality is noted. IMPRESSION: Stable left basilar changes.  No new focal abnormality is seen. Electronically Signed   By: Alcide Clever M.D.   On: 06/12/2017 10:32     ASSESSMENT AND PLAN:   81 year old female with past medical history of hypertension, hyperlipidemia, COPD, atrial fibrillation, chronic kidney disease stage III, obstructive sleep apnea, history of previous CVA who presented to the hospital due to shortness of breath.  1. Acute on chronic respiratory failure with hypoxia-secondary to CHF. -Continue O2 supplementation. Continue treatment for heart failure with IV diuresis and clinically improving.  2. CHF-acute on chronic diastolic dysfunction. Continue diuresis with IV  Lasix. Chest x-ray showing improvement  today. -We'll taper Lasix, follow I's and O's and daily weights. Wean oxygen as tolerated.  3. A. Fib - cont. Metoprolol. Rate controlled.  - cont. Eliquis.   4. COPD-mild acute exacerbation. -Continue IV steroids, scheduled DuoNeb's, Dulera,  improving.  5. Hyperlipidemia-continue simvastatin.  6. GERD-continue Protonix.  Appreciate Palliative Care consult and pt. Is DNR now.    All the records are reviewed and case discussed with Care Management/Social Worker. Management plans discussed with the patient, family and they are in agreement.  CODE STATUS: DNR  DVT Prophylaxis: Eliquis  TOTAL TIME TAKING CARE OF THIS PATIENT: 30 minutes.   POSSIBLE D/C IN 1-2 DAYS, DEPENDING ON CLINICAL CONDITION.   Houston SirenSAINANI,Gavan Nordby J M.D on 06/12/2017 at 1:36 PM  Between 7am to 6pm - Pager - [phone]  After 6pm go to [email or web address] - Social research officer, governmentpassword EPAS [facility]  [facility] [place] [facility]  Office  [phone]  CC: Primary care physician; System, Pcp Not In

## 2017-06-13 LAB — CBC
HEMATOCRIT: 46.3 % (ref 35.0–47.0)
HEMOGLOBIN: 15.2 g/dL (ref 12.0–16.0)
MCH: 28.8 pg (ref 26.0–34.0)
MCHC: 32.7 g/dL (ref 32.0–36.0)
MCV: 88.1 fL (ref 80.0–100.0)
Platelets: 161 10*3/uL (ref 150–440)
RBC: 5.26 MIL/uL — AB (ref 3.80–5.20)
RDW: 15.8 % — AB (ref 11.5–14.5)
WBC: 8.9 10*3/uL (ref 3.6–11.0)

## 2017-06-13 LAB — CREATININE, SERUM
Creatinine, Ser: 0.76 mg/dL (ref 0.44–1.00)
GFR calc non Af Amer: >60 mL/min (ref >60)

## 2017-06-13 MED ORDER — POLYETHYLENE GLYCOL 3350 17 G PO PACK: 17.0000 g | PACK | Freq: Every day | ORAL | 0 refills | Status: AC | PRN

## 2017-06-13 MED ORDER — APIXABAN 5 MG PO TABS: 5.0000 mg | ORAL_TABLET | Freq: Two times a day (BID) | ORAL | Status: AC

## 2017-06-13 MED ORDER — IPRATROPIUM-ALBUTEROL 0.5-2.5 (3) MG/3ML IN SOLN: 3.0000 mL | Freq: Four times a day (QID) | RESPIRATORY_TRACT | Status: AC | PRN

## 2017-06-13 MED ORDER — MOMETASONE FURO-FORMOTEROL FUM 200-5 MCG/ACT IN AERO: 2.0000 | INHALATION_SPRAY | Freq: Two times a day (BID) | RESPIRATORY_TRACT | Status: AC

## 2017-06-13 MED ORDER — PREDNISONE 50 MG PO TABS: ORAL_TABLET | ORAL | 0 refills | Status: AC

## 2017-06-13 NOTE — Progress Notes (Signed)
SUBJECTIVE: Feeling abdominal pain   Vitals:   06/13/17 0031 06/13/17 0538 06/13/17 0719 06/13/17 0730  BP: 133/83  (!) 150/94   Pulse: 63  64   Resp: 16     Temp: 98 F (36.7 C)  97.6 F (36.4 C)   TempSrc: Oral  Oral   SpO2: 92%  92% 93%  Weight:  155 lb 12.8 oz (70.7 kg)    Height:        Intake/Output Summary (Last 24 hours) at 06/13/17 1219 Last data filed at 06/13/17 0800  Gross per 24 hour  Intake              360 ml  Output                0 ml  Net              360 ml    LABS: Basic Metabolic Panel:  Recent Labs  86/57/8407/16/18 0956 06/13/17 0455  NA 141  --   K 3.7  --   CL 96*  --   CO2 34*  --   GLUCOSE 138*  --   BUN 42*  --   CREATININE 0.99 0.76  CALCIUM 8.7*  --    Liver Function Tests: No results for input(s): AST, ALT, ALKPHOS, BILITOT, PROT, ALBUMIN in the last 72 hours. No results for input(s): LIPASE, AMYLASE in the last 72 hours. CBC:  Recent Labs  06/13/17 0455  WBC 8.9  HGB 15.2  HCT 46.3  MCV 88.1  PLT 161   Cardiac Enzymes: No results for input(s): CKTOTAL, CKMB, CKMBINDEX, TROPONINI in the last 72 hours. BNP: Invalid input(s): POCBNP D-Dimer: No results for input(s): DDIMER in the last 72 hours. Hemoglobin A1C: No results for input(s): HGBA1C in the last 72 hours. Fasting Lipid Panel: No results for input(s): CHOL, HDL, LDLCALC, TRIG, CHOLHDL, LDLDIRECT in the last 72 hours. Thyroid Function Tests: No results for input(s): TSH, T4TOTAL, T3FREE, THYROIDAB in the last 72 hours.  Invalid input(s): FREET3 Anemia Panel: No results for input(s): VITAMINB12, FOLATE, FERRITIN, TIBC, IRON, RETICCTPCT in the last 72 hours.   PHYSICAL EXAM General: Well developed, well nourished, in no acute distress HEENT:  Normocephalic and atramatic Neck:  No JVD.  Lungs: Clear bilaterally to auscultation and percussion. Heart: HRRR . Normal S1 and S2 without gallops or murmurs.  Abdomen: Bowel sounds are positive, abdomen soft and non-tender   Msk:  Back normal, normal gait. Normal strength and tone for age. Extremities: No clubbing, cyanosis or edema.   Neuro: Alert and oriented X 3. Psych:  Good affect, responds appropriately  TELEMETRY: Afib wit controlled rate  ASSESSMENT AND PLAN: Chronic Afib with controlled rate. Tolerating Eliquis for anticoagulation. Continue to treat CHF with oral diuretics. Elevated troponin was due to demand ischemia. Blood pressure is well controlled.    Principal Problem:   Acute on chronic respiratory failure (HCC) Active Problems:   HCAP (healthcare-associated pneumonia)   Atrial fibrillation, chronic (HCC)   COPD exacerbation (HCC)   Pressure injury of skin   Community acquired pneumonia   Palliative care by specialist   Goals of care, counseling/discussion   Abdominal pain   Constipation   DNR (do not resuscitate)    Laurier NancyKHAN,Massiel Stipp A, MD, [facility] HealthFACC 06/13/2017 12:19 PM

## 2017-06-13 NOTE — Progress Notes (Signed)
[facility] report [facility] to [facility]. Report have also been called to EMS for transport. Patient have no complaint or SS of distress.

## 2017-06-13 NOTE — Discharge Summary (Signed)
[facility] - [place] at [facility]   PATIENT NAME: Catherine Harris    MR#:  [id number]  DATE OF BIRTH:  07-17-1936  DATE OF ADMISSION:  06/04/2017 ADMITTING PHYSICIAN: Marguarite Arbour, MD  DATE OF DISCHARGE: 06/13/2017  PRIMARY CARE PHYSICIAN: System, Pcp Not In    ADMISSION DIAGNOSIS:  Shortness of breath [R06.02] Hypoxia [R09.02] HCAP (healthcare-associated pneumonia) [J18.9] Community acquired pneumonia, unspecified laterality [J18.9]  DISCHARGE DIAGNOSIS:  Principal Problem:   Acute on chronic respiratory failure (HCC) Active Problems:   HCAP (healthcare-associated pneumonia)   Atrial fibrillation, chronic (HCC)   COPD exacerbation (HCC)   Pressure injury of skin   Community acquired pneumonia   Palliative care by specialist   Goals of care, counseling/discussion   Abdominal pain   Constipation   DNR (do not resuscitate)   SECONDARY DIAGNOSIS:   Past Medical History:  Diagnosis Date  . A-fib (HCC)   . CKD (chronic kidney disease) stage 3, GFR 30-59 ml/min   . COPD (chronic obstructive pulmonary disease) (HCC)   . GERD (gastroesophageal reflux disease)   . Hyperlipidemia   . Hypertension   . IGT (impaired glucose tolerance)   . Renal disorder   . Sleep apnea   . Stroke (HCC)   . Thyroid disease     HOSPITAL COURSE:   81 year old female with past medical history of hypertension, hyperlipidemia, COPD, atrial fibrillation, chronic kidney disease stage III, obstructive sleep apnea, history of previous CVA who presented to the hospital due to shortness of breath.  1. Acute on chronic respiratory failure with hypoxia-secondary to CHF and  Mild COPD Exacerbation.  - pt. Was initially admitted to the ICU under stepdown level for care.  She was treated with IV steroids, bronchodilators for COPD and also IV Lasix for her CHF.  She also received IV abx for suspected pneumonia which was ruled out.   - she has clinically improved with Treatment as  mentioned above. She is no longer hypoxic and therefore being discharged.  2. CHF-acute on chronic diastolic dysfunction. Patient was aggressively diuresed with IV Lasix and has improved. Her chest x-ray also shows improvement in her pulmonary vascular congestion. -She was seen by cardiology recommended continuing her home dose Lasix along with her metoprolol.   3. A. Fib - she remained rate controlled while in the hospital.  She will continue her metoprolol. Patient was on Coumadin prior to coming to the hospital but her INR was supratherapeutic. She has been switched over to Eliquis presently.   4. COPD-mild acute exacerbation. -Patient was treated with IV steroids, bronchodilators and has clinically improved. Not being discharged on oral prednisone taper, Dulera and DuoNeb's as needed.  5. Hyperlipidemia- she will continue simvastatin.  6. GERD- she will continue Omeprazole.   7. Urinary Incontinence - she will cont. Her Detrol LA  8. Dementia - she will cont. Aricept  Palliative to Follow at SNF.   DISCHARGE CONDITIONS:   Stable  CONSULTS OBTAINED:  Treatment Team:  Tora Kindred, DO Laurier Nancy, MD Katha Hamming, MD  DRUG ALLERGIES:  No Known Allergies  DISCHARGE MEDICATIONS:   Allergies as of 06/13/2017   No Known Allergies     Medication List    STOP taking these medications   nitrofurantoin (macrocrystal-monohydrate) 100 MG capsule Commonly known as:  MACROBID   warfarin 3 MG tablet Commonly known as:  COUMADIN     TAKE these medications   apixaban 5 MG Tabs tablet Commonly known as:  ELIQUIS Take  1 tablet (5 mg total) by mouth 2 (two) times daily.   donepezil 5 MG tablet Commonly known as:  ARICEPT Take 5 mg by mouth at bedtime.   DULoxetine 30 MG capsule Commonly known as:  CYMBALTA Take 30 mg by mouth daily.   furosemide 20 MG tablet Commonly known as:  LASIX Take 20 mg by mouth daily.   ipratropium-albuterol 0.5-2.5 (3)  MG/3ML Soln Commonly known as:  DUONEB Take 3 mLs by nebulization every 6 (six) hours as needed.   lisinopril 5 MG tablet Commonly known as:  PRINIVIL,ZESTRIL Take 5 mg by mouth daily.   Melatonin 3 MG Tabs Take 3 mg by mouth at bedtime.   metoprolol tartrate 50 MG tablet Commonly known as:  LOPRESSOR Take 50 mg by mouth 2 (two) times daily.   mometasone-formoterol 200-5 MCG/ACT Aero Commonly known as:  DULERA Inhale 2 puffs into the lungs 2 (two) times daily.   MULTI-VITAMINS Tabs Take 1 tablet by mouth daily.   omeprazole 20 MG capsule Commonly known as:  PRILOSEC Take 20 mg by mouth daily.   polyethylene glycol packet Commonly known as:  MIRALAX / GLYCOLAX Take 17 g by mouth daily as needed for mild constipation.   predniSONE 50 MG tablet Commonly known as:  DELTASONE Label  & dispense according to the schedule below. 5 Pills PO for 1 day then, 4 Pills PO for 1 day, 3 Pills PO for 1 day, 2 Pills PO for 1 day, 1 Pill PO for 1 days then STOP.   simvastatin 20 MG tablet Commonly known as:  ZOCOR Take 20 mg by mouth every evening.   tolterodine 2 MG 24 hr capsule Commonly known as:  DETROL LA Take 1 capsule by mouth daily.   traZODone 50 MG tablet Commonly known as:  DESYREL Take 50 mg by mouth at bedtime.          DISCHARGE INSTRUCTIONS:   DIET:  Cardiac diet  DISCHARGE CONDITION:  Stable  ACTIVITY:  Activity as tolerated  OXYGEN:  Home Oxygen: No.   Oxygen Delivery: room air  DISCHARGE LOCATION:  nursing home   If you experience worsening of your admission symptoms, develop shortness of breath, life threatening emergency, suicidal or homicidal thoughts you must seek medical attention immediately by calling 911 or calling your MD immediately  if symptoms less severe.  You Must read complete instructions/literature along with all the possible adverse reactions/side effects for all the Medicines you take and that have been prescribed to you. Take  any new Medicines after you have completely understood and accpet all the possible adverse reactions/side effects.   Please note  You were cared for by a hospitalist during your hospital stay. If you have any questions about your discharge medications or the care you received while you were in the hospital after you are discharged, you can call the unit and asked to speak with the hospitalist on call if the hospitalist that took care of you is not available. Once you are discharged, your primary care physician will handle any further medical issues. Please note that NO REFILLS for any discharge medications will be authorized once you are discharged, as it is imperative that you return to your primary care physician (or establish a relationship with a primary care physician if you do not have one) for your aftercare needs so that they can reassess your need for medications and monitor your lab values.     Today   Shortness of breath improved.  No acute events overnight.    VITAL SIGNS:  Blood pressure (!) 150/94, pulse 64, temperature 97.6 F (36.4 C), temperature source Oral, resp. rate 16, height 5\' 5"  (1.651 m), weight 70.7 kg (155 lb 12.8 oz), SpO2 93 %.  I/O:   Intake/Output Summary (Last 24 hours) at 06/13/17 1328 Last data filed at 06/13/17 0800  Gross per 24 hour  Intake              360 ml  Output                0 ml  Net              360 ml    PHYSICAL EXAMINATION:  GENERAL:  81 y.o.-year-old patient lying in the bed with no acute distress.  EYES: Pupils equal, round, reactive to light and accommodation. No scleral icterus. Extraocular muscles intact.  HEENT: Head atraumatic, normocephalic. Oropharynx and nasopharynx clear.  NECK:  Supple, no jugular venous distention. No thyroid enlargement, no tenderness.  LUNGS: Normal breath sounds bilaterally, no wheezing, rales,rhonchi. No use of accessory muscles of respiration.  CARDIOVASCULAR: S1, S2 normal. No murmurs, rubs, or  gallops.  ABDOMEN: Soft, non-tender, non-distended. Bowel sounds present. No organomegaly or mass.  EXTREMITIES: No pedal edema, cyanosis, or clubbing.  NEUROLOGIC: Cranial nerves II through XII are intact. No focal motor or sensory defecits b/l.  PSYCHIATRIC: The patient is alert and oriented x 3.  SKIN: No obvious rash, lesion, or ulcer.   DATA REVIEW:   CBC  Recent Labs Lab 06/13/17 0455  WBC 8.9  HGB 15.2  HCT 46.3  PLT 161    Chemistries   Recent Labs Lab 06/12/17 0956 06/13/17 0455  NA 141  --   K 3.7  --   CL 96*  --   CO2 34*  --   GLUCOSE 138*  --   BUN 42*  --   CREATININE 0.99 0.76  CALCIUM 8.7*  --     Cardiac Enzymes No results for input(s): TROPONINI in the last 168 hours.  Microbiology Results  Results for orders placed or performed during the hospital encounter of 06/04/17  Blood culture (routine x 2)     Status: None   Collection Time: 06/04/17  1:50 PM  Result Value Ref Range Status   Specimen Description BLOOD RIGHT ARM  Final   Special Requests   Final    BOTTLES DRAWN AEROBIC AND ANAEROBIC Blood Culture adequate volume   Culture NO GROWTH 5 DAYS  Final   Report Status 06/09/2017 FINAL  Final  Blood culture (routine x 2)     Status: None   Collection Time: 06/04/17  1:55 PM  Result Value Ref Range Status   Specimen Description BLOOD LEFT ARM  Final   Special Requests   Final    BOTTLES DRAWN AEROBIC AND ANAEROBIC Blood Culture adequate volume   Culture NO GROWTH 5 DAYS  Final   Report Status 06/09/2017 FINAL  Final  MRSA PCR Screening     Status: None   Collection Time: 06/04/17  3:45 PM  Result Value Ref Range Status   MRSA by PCR NEGATIVE NEGATIVE Final    Comment:        The GeneXpert MRSA Assay (FDA approved for NASAL specimens only), is one component of a comprehensive MRSA colonization surveillance program. It is not intended to diagnose MRSA infection nor to guide or monitor treatment for MRSA infections.      RADIOLOGY:  Dg Chest Port 1 View  Result Date: 06/12/2017 CLINICAL DATA:  Increasing shortness of Breath EXAM: PORTABLE CHEST 1 VIEW COMPARISON:  06/08/2017 FINDINGS: Cardiac shadow is stable. Persistent left lower lobe consolidation and small left effusion is seen and stable. Right lung is clear. No acute bony abnormality is noted. IMPRESSION: Stable left basilar changes.  No new focal abnormality is seen. Electronically Signed   By: Alcide Clever M.D.   On: 06/12/2017 10:32      Management plans discussed with the patient, family and they are in agreement.  CODE STATUS:     Code Status Orders        Start     Ordered   06/12/17 1302  Do not attempt resuscitation (DNR)  Continuous    Question Answer Comment  In the event of cardiac or respiratory ARREST Do not call a "code blue"   In the event of cardiac or respiratory ARREST Do not perform Intubation, CPR, defibrillation or ACLS   In the event of cardiac or respiratory ARREST Use medication by any route, position, wound care, and other measures to relive pain and suffering. May use oxygen, suction and manual treatment of airway obstruction as needed for comfort.      06/12/17 1301    Code Status History    Date Active Date Inactive Code Status Order ID Comments User Context   06/04/2017  3:44 PM 06/12/2017  1:01 PM Full Code [id number]  Marguarite Arbour, MD Inpatient      TOTAL TIME TAKING CARE OF THIS PATIENT: 40 minutes.    [place] Siren M.D on 06/13/2017 at 1:28 PM  Between 7am to 6pm - Pager - [phone]  After 6pm go to [email or web address] - Social research officer, government  [facility] [place] [facility]  Office  [phone]  CC: Primary care physician; System, Pcp Not In

## 2017-06-13 NOTE — Progress Notes (Addendum)
Patient is medically stable for D[facility]C back [facility] today[facility], auth # [id number], RUC. Per Jomarie LongsJoseph [facility] liaison patient can come today to room 710. RN will call report to RN Konrad DoloresKim Hicks at [phone] and arrange EMS for transport. Clinical Child psychotherapistocial Worker (CSW) sent D/C orders to [facility] via [facility]. Patient is aware of above. CSW attempted to contact patient's step daughter Myriam JacobsonHelen however she did not answer and a voicemail was left. Please reconsult if future social work work needs arise. CSW signing off.   Patient's step daughter Myriam JacobsonHelen called CSW back and is in agreement with D/C plan.   Baker Hughes IncorporatedBailey Elva Mauro, LCSW [phone]

## 2018-03-28 DEATH — deceased

## 2018-05-09 IMAGING — CT CT ABD-PELV W/ CM
2 of 5 series · 16 of 46 positions shown, 18 images · IV contrast (APPLIED)
Comparison: None available currently.

CLINICAL DATA: Generalized abdominal pain, nausea.

EXAM:
CT ABDOMEN AND PELVIS WITH CONTRAST
TECHNIQUE: Multidetector CT imaging of the abdomen and pelvis was performed
using the standard protocol following bolus administration of
intravenous contrast.
CONTRAST:  100mL [id number] IOPAMIDOL ([id number]) INJECTION 61%

[Series 2: routine abd/pel with · axial · 0.82mm/px · z∈[-1053,-628]mm · 13 of 96 slices shown, 15 images]
[im 6/96  soft-tissue]
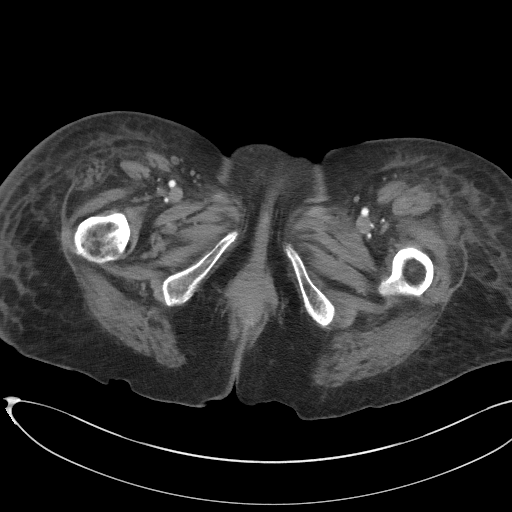
[im 6/96  bone]
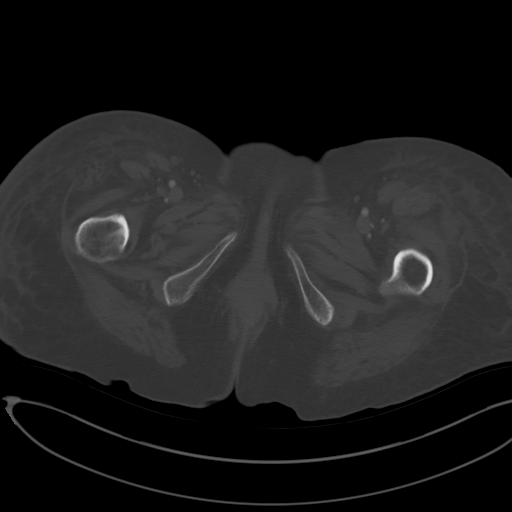
[im 16/96  soft-tissue]
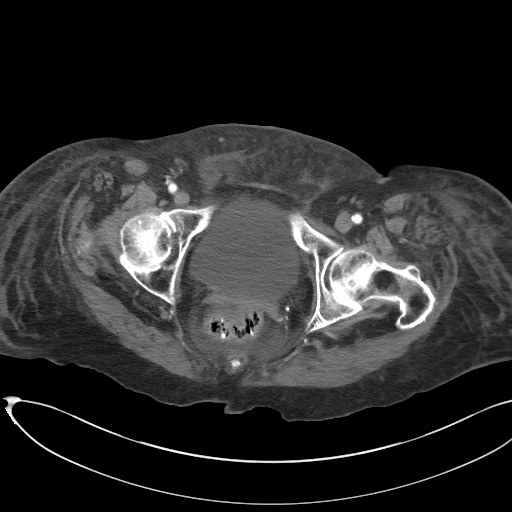
[im 21/96  soft-tissue]
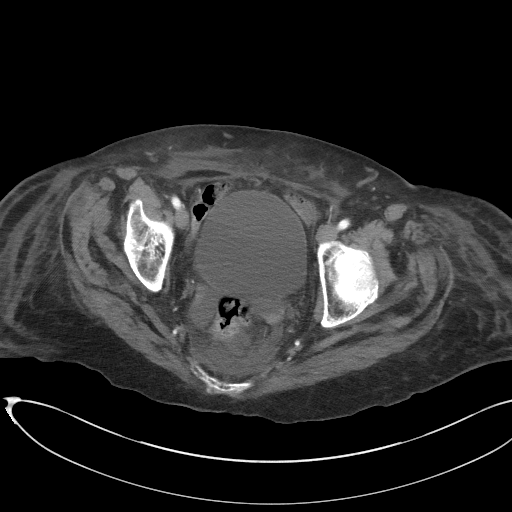
[im 26/96  soft-tissue]
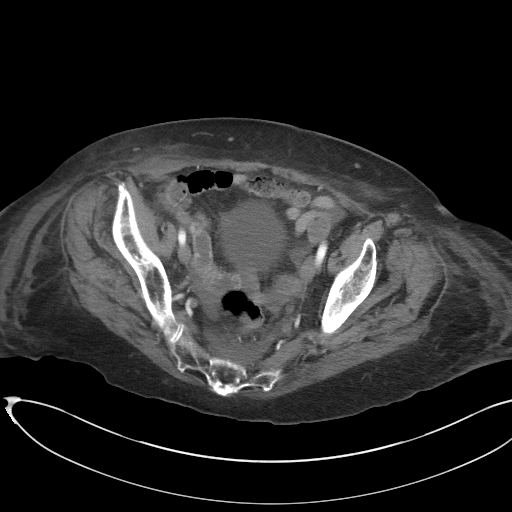
[im 36/96  soft-tissue]
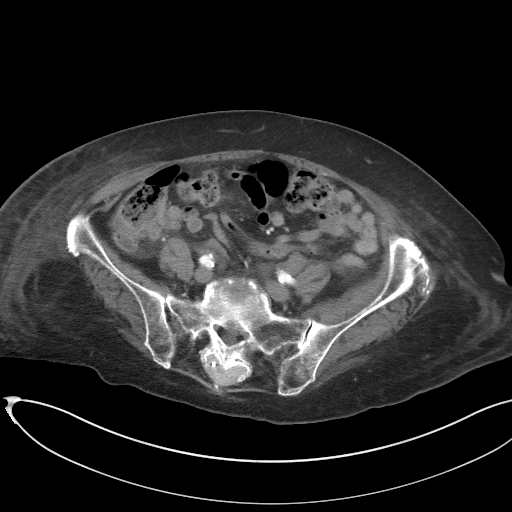
[im 41/96  soft-tissue]
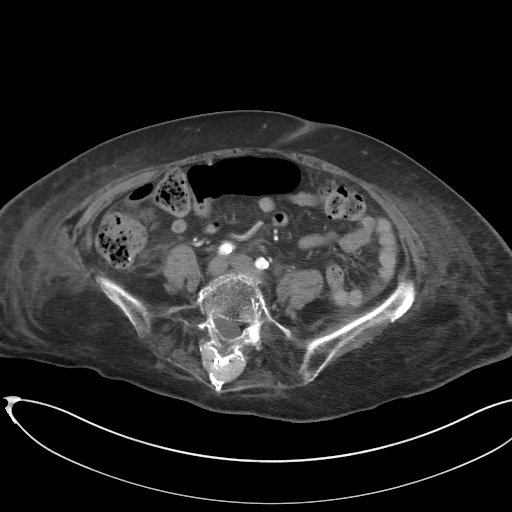
[im 51/96  soft-tissue]
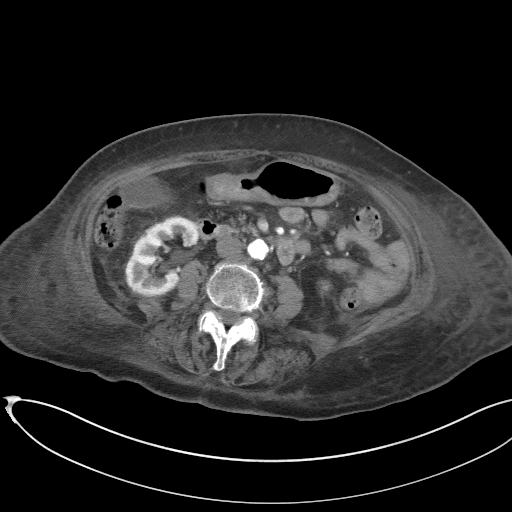
[im 56/96  soft-tissue]
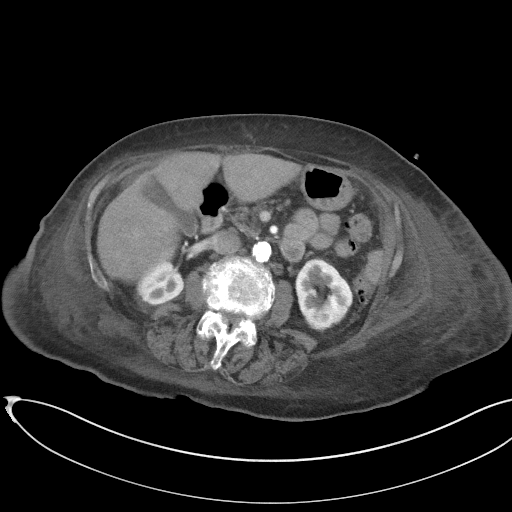
[im 61/96  soft-tissue]
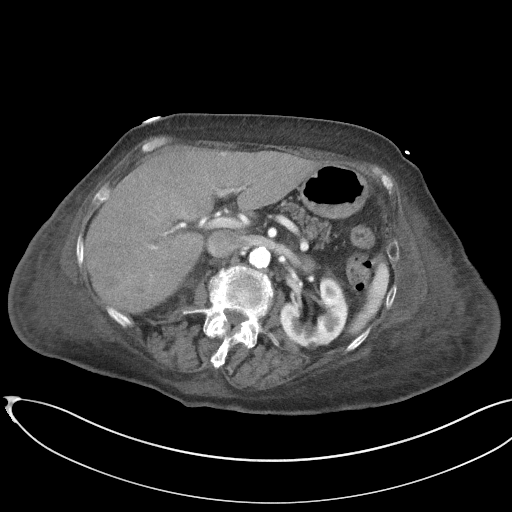
[im 61/96  bone]
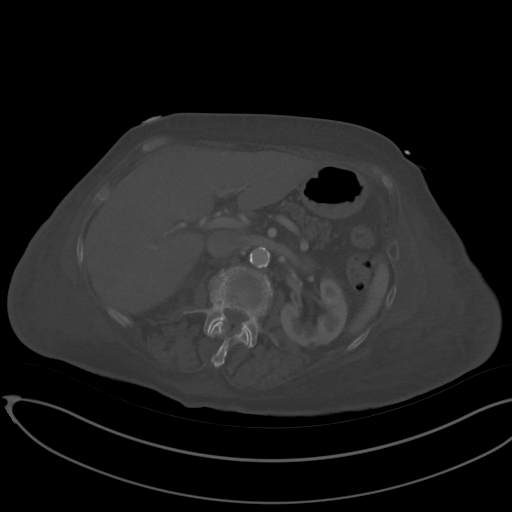
[im 71/96  soft-tissue]
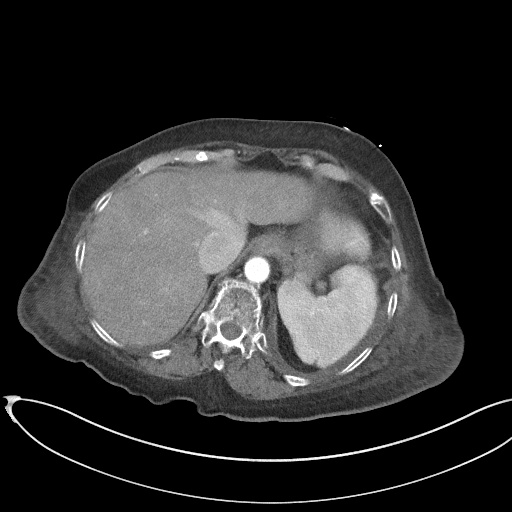
[im 76/96  soft-tissue]
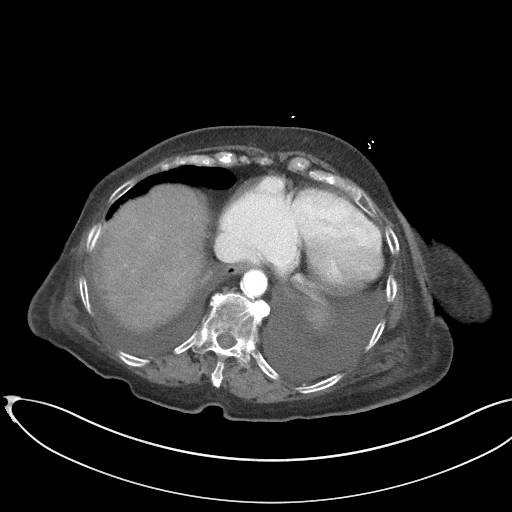
[im 81/96  soft-tissue]
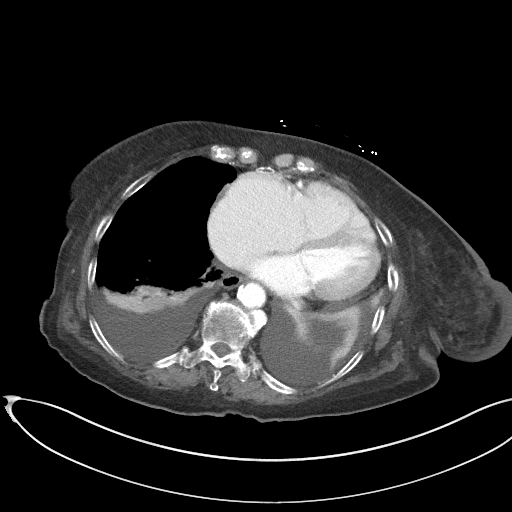
[im 91/96  soft-tissue]
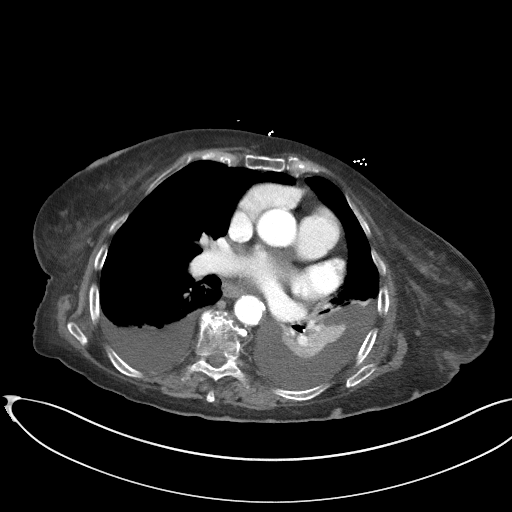

[Series 5: coronal st · coronal · 0.77mm/px · 3 of 82 slices shown]
[im 28/82  soft-tissue]
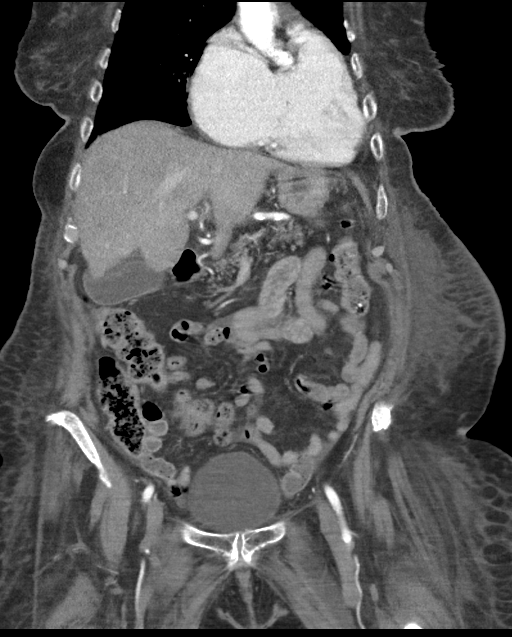
[im 37/82  soft-tissue]
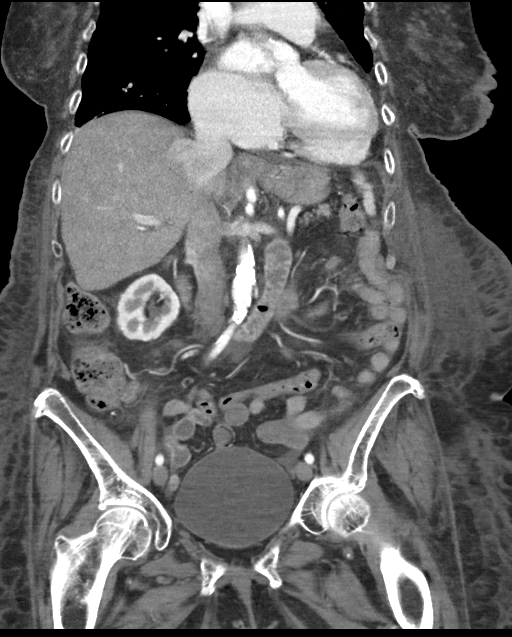
[im 46/82  soft-tissue]
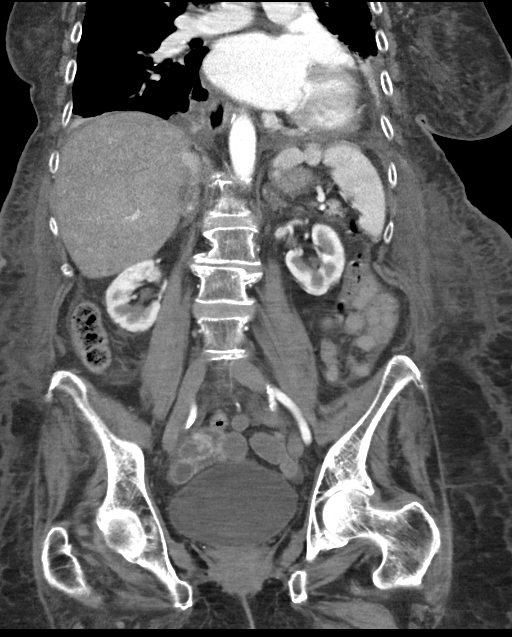

[16 of 46 positions shown; findings below may reference images not displayed]

FINDINGS: Lower chest: Moderate bilateral pleural effusions are noted with
adjacent subsegmental atelectasis. Mild cardiomegaly is noted.

Hepatobiliary: No focal liver abnormality is seen. Heterogeneous
appearance of hepatic parenchyma is noted suggesting diffuse
hepatocellular disease or fatty infiltration. No gallstones,
gallbladder wall thickening, or biliary dilatation. Enlargement of
hepatic veins is noted as well as right atrium suggesting pulmonary
venous hypertension.

Pancreas: Unremarkable. No pancreatic ductal dilatation or
surrounding inflammatory changes.

Spleen: Normal in size without focal abnormality.

Adrenals/Urinary Tract: Adrenal glands are unremarkable. Kidneys are
normal, without renal calculi, focal lesion, or hydronephrosis.
Bladder is unremarkable.

Stomach/Bowel: No bowel dilatation or inflammation is noted. The
appendix is not clearly identified.

Vascular/Lymphatic: Aortic atherosclerosis. No enlarged abdominal or
pelvic lymph nodes.

Reproductive: Status post hysterectomy. No adnexal masses.

Other: Mild anasarca is noted. No hernias are noted. Small amount of
free fluid is noted posteriorly in the pelvis.

Musculoskeletal: No acute or significant osseous findings.
IMPRESSION: Moderate bilateral pleural effusions are noted with adjacent
subsegmental atelectasis.

Heterogeneous appearance of hepatic parenchyma is noted suggesting
diffuse hepatocellular disease or fatty infiltration.

Aortic atherosclerosis.

Mild anasarca.

Mild amount of free fluid is noted posteriorly in the pelvis.

Enlargement of hepatic veins and right atrium is noted suggesting
pulmonary artery hypertension or other right-sided heart disease.

No other definite abnormality seen in the abdomen or pelvis.

## 2018-05-12 IMAGING — DX DG CHEST 1V PORT
1 series · 1 of 1 positions shown · non-contrast
Comparison: 06/08/2017

CLINICAL DATA: Increasing shortness of Breath

EXAM:
PORTABLE CHEST 1 VIEW

[chest ap]
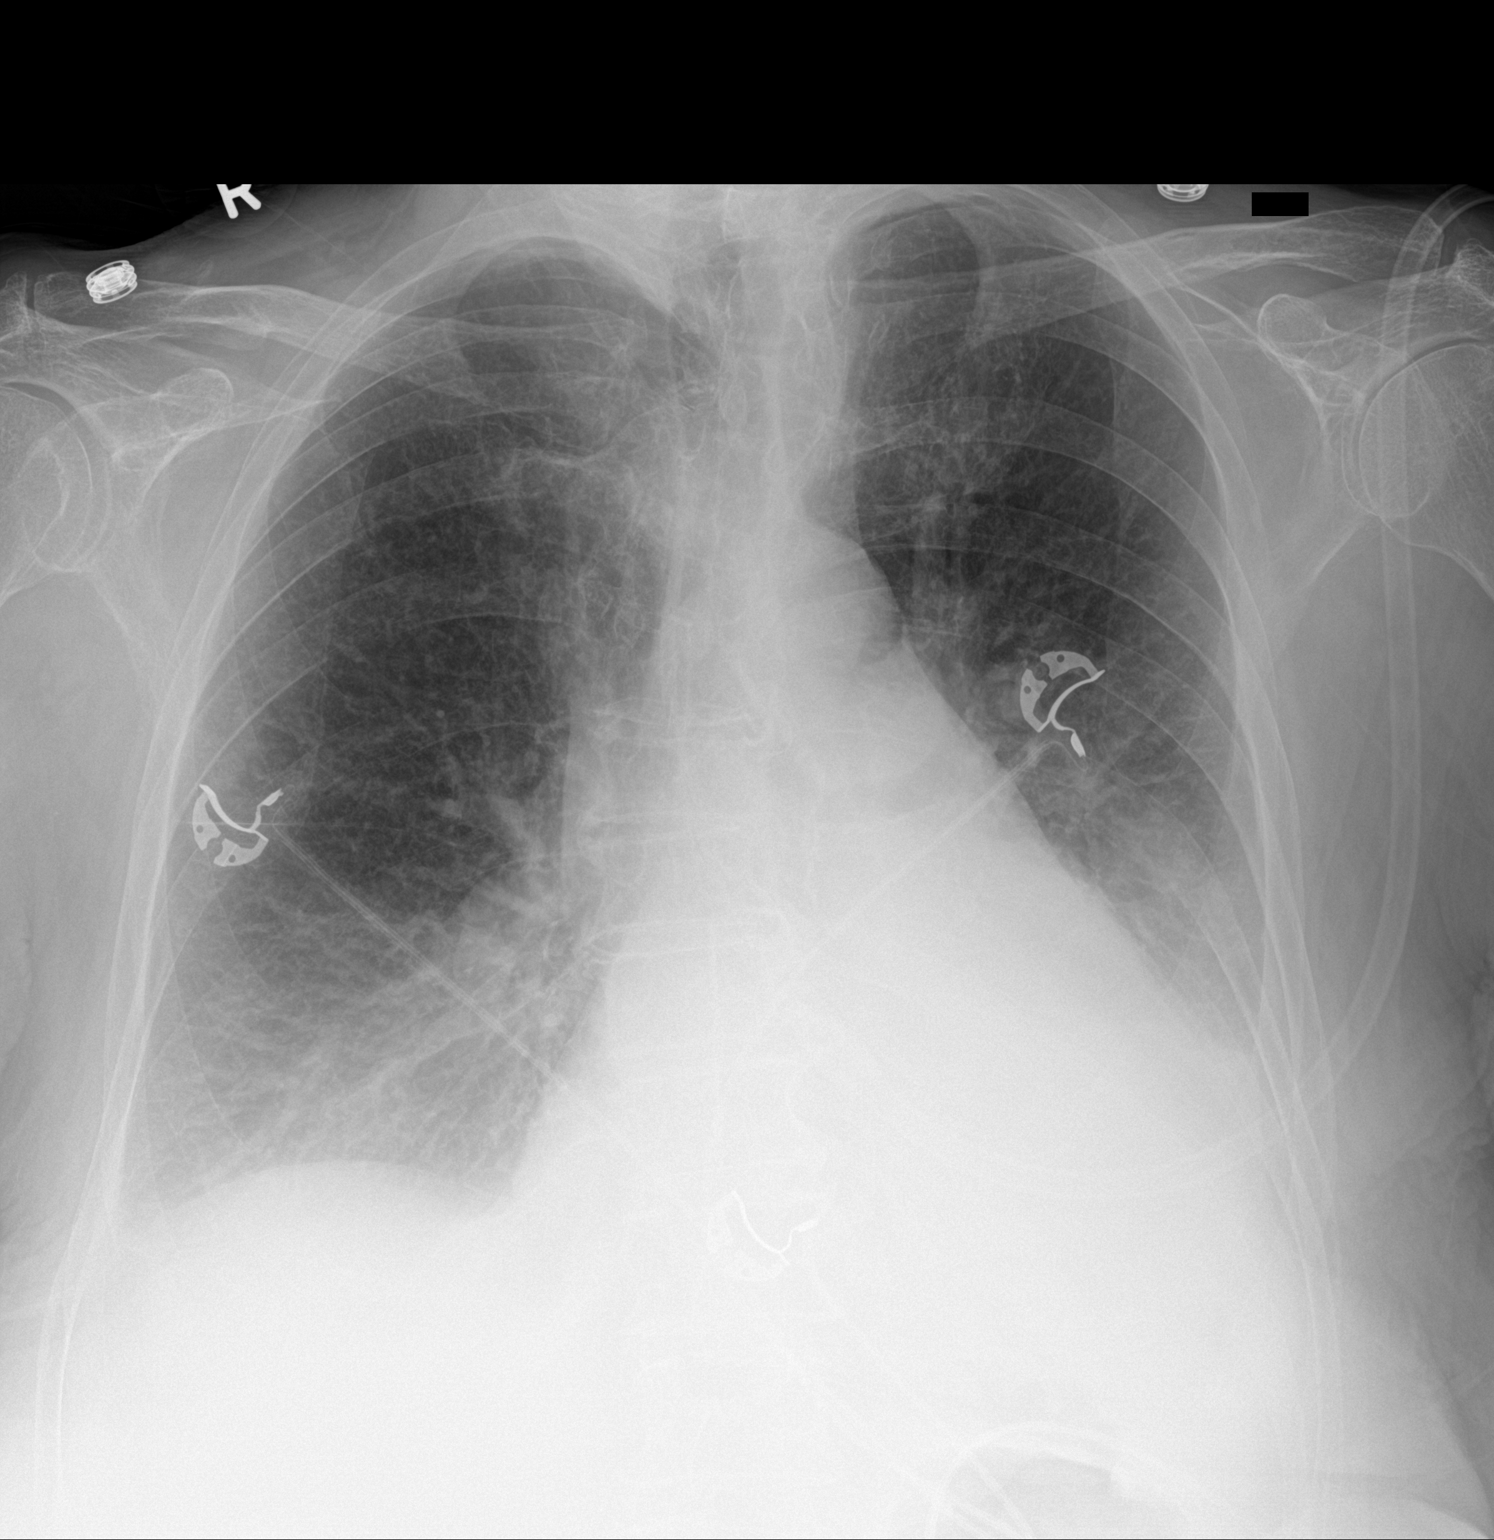

[1 of 1 positions shown; findings below may reference images not displayed]

FINDINGS: Cardiac shadow is stable. Persistent left lower lobe consolidation
and small left effusion is seen and stable. Right lung is clear. No
acute bony abnormality is noted.
IMPRESSION: Stable left basilar changes.  No new focal abnormality is seen.
